# Patient Record
Sex: Female | Born: 1975 | Race: Black or African American | Hispanic: No | Marital: Single | Smoking: Former smoker
Health system: Southern US, Community
[De-identification: ages and names within clinical notes are randomized; demographics above are authoritative.]

## PROBLEM LIST (undated history)

## (undated) HISTORY — PX: TUBAL LIGATION: SHX77

---

## 1998-06-29 ENCOUNTER — Inpatient Hospital Stay (HOSPITAL_COMMUNITY): Admission: AD | Admit: 1998-06-29 | Discharge: 1998-06-29 | Payer: Self-pay | Admitting: Obstetrics

## 1998-06-29 ENCOUNTER — Emergency Department (HOSPITAL_COMMUNITY): Admission: EM | Admit: 1998-06-29 | Discharge: 1998-06-29 | Payer: Self-pay | Admitting: *Deleted

## 1998-07-11 ENCOUNTER — Inpatient Hospital Stay (HOSPITAL_COMMUNITY): Admission: AD | Admit: 1998-07-11 | Discharge: 1998-07-11 | Payer: Self-pay | Admitting: Obstetrics

## 1998-07-31 ENCOUNTER — Other Ambulatory Visit: Admission: RE | Admit: 1998-07-31 | Discharge: 1998-07-31 | Payer: Self-pay | Admitting: Obstetrics

## 1998-07-31 ENCOUNTER — Ambulatory Visit (HOSPITAL_COMMUNITY): Admission: RE | Admit: 1998-07-31 | Discharge: 1998-07-31 | Payer: Self-pay | Admitting: Obstetrics

## 1998-12-05 ENCOUNTER — Ambulatory Visit (HOSPITAL_COMMUNITY): Admission: RE | Admit: 1998-12-05 | Discharge: 1998-12-05 | Payer: Self-pay | Admitting: Obstetrics

## 1999-02-03 ENCOUNTER — Encounter (HOSPITAL_COMMUNITY): Admission: RE | Admit: 1999-02-03 | Discharge: 1999-02-11 | Payer: Self-pay | Admitting: Obstetrics

## 1999-02-05 ENCOUNTER — Inpatient Hospital Stay (HOSPITAL_COMMUNITY): Admission: AD | Admit: 1999-02-05 | Discharge: 1999-02-07 | Payer: Self-pay | Admitting: Obstetrics

## 1999-08-23 ENCOUNTER — Inpatient Hospital Stay (HOSPITAL_COMMUNITY): Admission: AD | Admit: 1999-08-23 | Discharge: 1999-08-23 | Payer: Self-pay | Admitting: Obstetrics

## 1999-10-26 ENCOUNTER — Inpatient Hospital Stay (HOSPITAL_COMMUNITY): Admission: AD | Admit: 1999-10-26 | Discharge: 1999-10-26 | Payer: Self-pay | Admitting: Obstetrics

## 1999-10-26 ENCOUNTER — Encounter: Payer: Self-pay | Admitting: *Deleted

## 1999-12-05 ENCOUNTER — Inpatient Hospital Stay (HOSPITAL_COMMUNITY): Admission: AD | Admit: 1999-12-05 | Discharge: 1999-12-06 | Payer: Self-pay | Admitting: Obstetrics

## 2000-03-16 ENCOUNTER — Other Ambulatory Visit: Admission: RE | Admit: 2000-03-16 | Discharge: 2000-03-16 | Payer: Self-pay | Admitting: Obstetrics

## 2000-05-28 ENCOUNTER — Emergency Department (HOSPITAL_COMMUNITY): Admission: EM | Admit: 2000-05-28 | Discharge: 2000-05-28 | Payer: Self-pay | Admitting: Emergency Medicine

## 2000-08-08 ENCOUNTER — Emergency Department (HOSPITAL_COMMUNITY): Admission: EM | Admit: 2000-08-08 | Discharge: 2000-08-08 | Payer: Self-pay | Admitting: Emergency Medicine

## 2000-08-08 ENCOUNTER — Encounter: Payer: Self-pay | Admitting: Emergency Medicine

## 2000-09-23 ENCOUNTER — Emergency Department (HOSPITAL_COMMUNITY): Admission: EM | Admit: 2000-09-23 | Discharge: 2000-09-23 | Payer: Self-pay

## 2000-09-24 ENCOUNTER — Emergency Department (HOSPITAL_COMMUNITY): Admission: EM | Admit: 2000-09-24 | Discharge: 2000-09-24 | Payer: Self-pay | Admitting: *Deleted

## 2000-10-01 ENCOUNTER — Encounter: Payer: Self-pay | Admitting: Emergency Medicine

## 2000-10-01 ENCOUNTER — Emergency Department (HOSPITAL_COMMUNITY): Admission: EM | Admit: 2000-10-01 | Discharge: 2000-10-02 | Payer: Self-pay | Admitting: Emergency Medicine

## 2001-06-24 ENCOUNTER — Emergency Department (HOSPITAL_COMMUNITY): Admission: EM | Admit: 2001-06-24 | Discharge: 2001-06-24 | Payer: Self-pay

## 2001-10-03 ENCOUNTER — Inpatient Hospital Stay (HOSPITAL_COMMUNITY): Admission: AD | Admit: 2001-10-03 | Discharge: 2001-10-03 | Payer: Self-pay | Admitting: *Deleted

## 2002-03-14 ENCOUNTER — Emergency Department (HOSPITAL_COMMUNITY): Admission: EM | Admit: 2002-03-14 | Discharge: 2002-03-14 | Payer: Self-pay | Admitting: Emergency Medicine

## 2003-08-25 ENCOUNTER — Emergency Department (HOSPITAL_COMMUNITY): Admission: EM | Admit: 2003-08-25 | Discharge: 2003-08-25 | Payer: Self-pay | Admitting: *Deleted

## 2004-02-18 ENCOUNTER — Emergency Department (HOSPITAL_COMMUNITY): Admission: EM | Admit: 2004-02-18 | Discharge: 2004-02-18 | Payer: Self-pay | Admitting: Emergency Medicine

## 2004-03-24 ENCOUNTER — Inpatient Hospital Stay (HOSPITAL_COMMUNITY): Admission: AD | Admit: 2004-03-24 | Discharge: 2004-03-24 | Payer: Self-pay | Admitting: Family Medicine

## 2004-04-21 ENCOUNTER — Other Ambulatory Visit: Admission: RE | Admit: 2004-04-21 | Discharge: 2004-04-21 | Payer: Self-pay | Admitting: Obstetrics and Gynecology

## 2004-09-04 ENCOUNTER — Inpatient Hospital Stay (HOSPITAL_COMMUNITY): Admission: AD | Admit: 2004-09-04 | Discharge: 2004-09-04 | Payer: Self-pay | Admitting: Obstetrics and Gynecology

## 2004-09-11 ENCOUNTER — Inpatient Hospital Stay (HOSPITAL_COMMUNITY): Admission: AD | Admit: 2004-09-11 | Discharge: 2004-09-11 | Payer: Self-pay | Admitting: Obstetrics and Gynecology

## 2004-09-20 ENCOUNTER — Inpatient Hospital Stay (HOSPITAL_COMMUNITY): Admission: AD | Admit: 2004-09-20 | Discharge: 2004-09-22 | Payer: Self-pay | Admitting: Family Medicine

## 2004-09-21 ENCOUNTER — Encounter (INDEPENDENT_AMBULATORY_CARE_PROVIDER_SITE_OTHER): Payer: Self-pay | Admitting: Specialist

## 2004-09-30 ENCOUNTER — Inpatient Hospital Stay (HOSPITAL_COMMUNITY): Admission: AD | Admit: 2004-09-30 | Discharge: 2004-09-30 | Payer: Self-pay | Admitting: Obstetrics and Gynecology

## 2004-09-30 ENCOUNTER — Emergency Department (HOSPITAL_COMMUNITY): Admission: EM | Admit: 2004-09-30 | Discharge: 2004-09-30 | Payer: Self-pay | Admitting: Emergency Medicine

## 2005-11-26 ENCOUNTER — Emergency Department (HOSPITAL_COMMUNITY): Admission: EM | Admit: 2005-11-26 | Discharge: 2005-11-26 | Payer: Self-pay | Admitting: Emergency Medicine

## 2007-01-20 ENCOUNTER — Emergency Department (HOSPITAL_COMMUNITY): Admission: EM | Admit: 2007-01-20 | Discharge: 2007-01-20 | Payer: Self-pay | Admitting: Emergency Medicine

## 2008-03-06 ENCOUNTER — Emergency Department (HOSPITAL_COMMUNITY): Admission: EM | Admit: 2008-03-06 | Discharge: 2008-03-06 | Payer: Self-pay | Admitting: Emergency Medicine

## 2011-04-16 NOTE — Consult Note (Signed)
NAMEVICKIE, Hicks NO.:  [id number]   MEDICAL RECORD NO.:  [id number]          PATIENT TYPE:  MAT   LOCATION:  MATC                          [facility]   PHYSICIAN:  Janine Limbo, M.D.DATE OF BIRTH:  1976/09/06   DATE OF CONSULTATION:  09/30/2004  DATE OF DISCHARGE:                                   CONSULTATION   HISTORY OF PRESENT ILLNESS:  [name] is a 35 year old female, now para 1-2-  3-3, who had a vaginal delivery on September 20, 2004.  She presented to  [facility] today complaining of boils on her arm, leg and her  upper thigh.  The patient reports that she had similar boils one year ago,  and she was told that she had a staph infection.  The patient's infant was  born at [redacted] weeks gestation, and the infant is currently in the Neonatal  Intensive Care Unit.  She was told that she had to have these boils cultured  prior to seeing her baby in the NICU.  She reports that the area on the  upper thigh is particularly tender.  The other areas are not as tender.   OBSTETRICAL HISTORY:  The patient has had one term vaginal delivery.  She  has had three elective pregnancy terminations.  She had one delivery at [redacted]  weeks gestation where she delivered a 4 pound 6 ounce female infant (Most  recently she had a 32 week delivery where she delivered a little boy at 32  weeks.).   PAST MEDICAL HISTORY:  Denies hypertension and diabetes.   SOCIAL HISTORY:  The patient no longer smokes cigarettes.  Denies alcohol  and recreational drug use.   REVIEW OF SYSTEMS:  Please see history of present illness.   FAMILY HISTORY:  Noncontributory.   PHYSICAL EXAMINATION:  VITAL SIGNS:  Temperature 98.5, pulse 69,  respirations 20, blood pressure 113/64.  HEENT:  Within normal limits.  EXTREMITIES:  An area of induration measuring approximately 3 cm x 3 cm on  the right forearm.  There is a healing scab on the lesion.  Slightly  purulent fluid was expressed  from this lesion.  The area was cultured.  The  patient has two similar lesions on her lower leg.  They are minimally  tender.  The patient has a tender area of induration on her upper thigh on  the right that measures approximately 5x5 cm in size.  The area is not  pointing.  It is indurated and slightly red to touch.   ASSESSMENT:  Recurrent skin infections of uncertain etiology.  The patient  has a past history of staph infections.  The patient denies insect bites or  any other etiologies that she knows of.   PLAN:  The patient was given doxycycline to take twice each day for 10 days  by the emergency room physician at [facility].  The culture was  sent to the lab to look for staph, and in particular, to look for  methicillin-  resistant Staphylococcus.  The patient was told to  sit in a tub of hot water  twice a day in hopes of helping the area on her thigh heal.  She understands  that this area may need to be incised and drained.  She will see her family  physician to have that done if necessary.     Arth   AVS/MEDQ  D:  09/30/2004  T:  10/01/2004  Job:  [id number]

## 2011-04-16 NOTE — H&P (Signed)
NAME:  Rachel Hicks, Rachel Hicks                  ACCOUNT NO.:  [id number]   MEDICAL RECORD NO.:  [id number]          PATIENT TYPE:  INP   LOCATION:  9163                          [facility]   PHYSICIAN:  Naima A. Dillard, M.D. DATE OF BIRTH:  1976/08/28   DATE OF ADMISSION:  09/20/2004  DATE OF DISCHARGE:                                HISTORY & PHYSICAL   HISTORY OF PRESENT PREGNANCY:  The patient is a [id number] presenting with  PPROM  She was counseled by Dr. Pennie Rushing regarding starting Delalutin injections for  prevention of preterm labor, which the patient declined at 18 weeks.  She  was scheduled for genetic counseling at [facility] for  heterozygous CF mutation.  Ultrasound at 19 weeks showed size equal to dates  and normal anatomy.  She has been size equal to dates throughout,  normotensive with no proteinuria.  She was evaluated at maternity admissions  on September 04, 2004 for occasional contractions.  Her fetal fibronectin at  that time was negative.  She was diagnosed with Trichomonas and medicated  with metronidazole 2 grams.  She had a closed and long cervix and irregular  contractions, which stopped following terbutaline.  She was therefore  discharged at that time and on 09/11/2004,  she presented to maternity  admissions with questionable rupture of membranes.  Sterile speculum exam at  that time found no pooling,  negative nitrazine, negative ferning, and with  no evidence of rupture of membranes, she was discharged home.  She was noted  to have a positive group B Strep culture.   PRENATAL LABORATORY:  Hemoglobin and hematocrit 11.6 and 34.5, platelets  275,000.  Blood type and Rh B positive, antibody screen negative.  Sickle  cell trait negative.  VDRL nonreactive.  Rubella immune.  Hepatitis B  surface antigen negative.  HIV nonreactive.  Pap smear within normal limits.  GC and Chlamydia negative.  Positive CF carrier.  Quad screen declined.  One  hour glucose  challenge 112.  RPR nonreactive.  Hemoglobin 11.7.  Fetal  fibronectin on September 04, 2004 was negative.  Group B Strep at that time was  positive.   OBSTETRIC HISTORY:  In 2000, the patient had a spontaneous vaginal delivery  with the birth of a 6 pound 8 ounce female named Derrick, with no  complications.  In 2001, the patient had a preterm delivery at approximately  [redacted] weeks gestation with the birth of a 4 pound 6 ounce female infant named  [place].  The baby was in the NICU x1 week.  In 2002, 2003, and 2004, the  patient had first trimester elective ABs with no complications.  Now, she is  pregnant with her sixth pregnancy.   MEDICAL HISTORY:  Significant for preterm labor and delivery with her last  pregnancy.  She was diagnosed with Trichomonas in 2003 and also with this  pregnancy.  She has history of cigarette smoking.  She quit with positive  UPT.  She denies the use of alcohol of illicit drugs.   FAMILY HISTORY:  Unremarkable.   GENETIC HISTORY:  There is no family history of familiar or genetic  disorders, children that died in infancy, or that were born with birth  defects.  The patient is found to be positive as a CF carrier.   SOCIAL HISTORY:  Ms. Chambless is a 35 year old African American female.  She is  single and the father of the baby is not involved.  She is [employer] in her  faith.   REVIEW OF SYSTEMS:  The patient presents with complaints of severe  contractions and lower abdominal pain.  She was found to be 2 cm dilated  with vertex at a -1 station.  She was given terbutaline subcu and found to  have rupture of membranes.  She is admitted to [facility] with preterm premature rupture of membranes and preterm labor.   PHYSICAL EXAMINATION:  Vital signs stable, afebrile.  HEENT:  Unremarkable.  HEART:  Regular rate and rhythm.  LUNGS:  Clear.  ABDOMEN:  Gravid in its contour.  Uterine fundus is noted to extend 31 cm  above the level of the pubic  symphysis.  Leopold maneuver signs the infant  to be in a longitudinal lie, cephalic presentation and the estimated fetal  weight is four and a half pounds. The fetal heart rate is 170s with positive  variability.  There are no contractions noted on tocometer, however, the  patient is writhing and moving all over the bed.  Her cervix was noted to be  2 cm dilated, 90%V effaced with a vertex at a -1 station.  She is leaking  clear fluid, ferning positive.  EXTREMITIES:  No pathologic edema.  DTRs are 1+ with no clonus.   Terbutaline 0.25 mg subcu was given.   ASSESSMENT:  1.  Intrauterine pregnancy at 31 6/7 weeks.  2.  Premature rupture of membranes in preterm labor.   PLAN:  1.  Admit per Dr. Jaymes Graff.  2.  Start magnesium sulfate 4 gram bolus IV, then 2 gram/hour continuously      and medicate patient with betamethasone 12 mg IM now and repeat in 24      hours.  3.  Obtain complete OB ultrasound.  4.  Start penicillin G prophylaxis for positive group B strep.  5.  The patient was taken to birthing suites to begin tocolysis.     Pecolia Ades   SDM/MEDQ  D:  09/20/2004  T:  09/20/2004  Job:  [id number]

## 2011-04-16 NOTE — Op Note (Signed)
NAMEATHINA, FAHEY                  ACCOUNT NO.:  [id number]   MEDICAL RECORD NO.:  [id number]          PATIENT TYPE:  INP   LOCATION:  9319                          [facility]   PHYSICIAN:  Janine Limbo, M.D.DATE OF BIRTH:  11-11-1976   DATE OF PROCEDURE:  09/21/2004  DATE OF DISCHARGE:                                 OPERATIVE REPORT   PREOPERATIVE DIAGNOSES:  1.  Postpartum day #1.  2.  Desires sterilization.   POSTOPERATIVE DIAGNOSES:  1.  Postpartum day #1.  2.  Desires sterilization.   PROCEDURE:  Postpartum bilateral tubal ligation.   SURGEON:  Janine Limbo, M.D.   ANESTHESIA:  General.   DISPOSITION:  Ms. Kerrick is a 35 year old female, now para 1-2-3-3, who had a  32-week delivery on September 20, 2004.  That infant is currently in the  neonatal intensive care unit.  The patient desires permanent sterilization.  She understands the risks associated with her procedure, which include, but  are not limited to, anesthetic complications, bleeding, infections, possible  damage to the surrounding organs, and possible tubal failure (17 per 1000).   FINDINGS:  The fallopian tubes were normal bilaterally.   PROCEDURE:  The patient was taken to the operating room, where a general  anesthetic was given.  The patient's abdomen was prepped with multiple  layers of Betadine and then sterilely draped.  The subumbilical area was  injected with 7 mL of 0.5% Marcaine with epinephrine.  A low transverse  incision was made and carried sharply through the subcutaneous tissue, the  fascia, and the anterior peritoneum.  The left fallopian tube was identified  and followed to its fimbriated end.  A knuckle of tube was made on the left  using a free tie and then a suture ligature of 0 plain catgut.  The knuckle  of tube thus made was excised.  Hemostasis was adequate.  An identical  procedure was carried out on the opposite side.  Again hemostasis was  adequate.  All instruments  were removed.  The fascia was closed using a  running suture of 0 Vicryl.  The skin was reapproximated using a  subcuticular suture of 4-0 Vicryl.  The sponge, needle, and instrument  counts were correct on two occasions.  The estimated blood loss was  essentially zero.  The patient tolerated her procedure well.  She was  awakened from her anesthetic and taken to the recovery room in stable  condition.     Arth   AVS/MEDQ  D:  09/21/2004  T:  09/21/2004  Job:  [id number]

## 2011-04-16 NOTE — H&P (Signed)
NAME:  GAZELLE, TOWE                  ACCOUNT NO.:  [id number]   MEDICAL RECORD NO.:  [id number]          PATIENT TYPE:  INP   LOCATION:  9163                          [facility]   PHYSICIAN:  Naima A. Dillard, M.D. DATE OF BIRTH:  02-17-1976   DATE OF ADMISSION:  09/20/2004  DATE OF DISCHARGE:                                HISTORY & PHYSICAL   HISTORY OF PRESENT ILLNESS:  Ms. Kentner is a 35 year old gravida 6, para 1-1-3-  2, at 31-6/7 weeks, EDD November 17, 2004, as determined by six-week  ultrasound and confirmed with followup.  She presents with contractions and  lower abdominal pain which have increased.  She is moaning and crying out  with her contractions.  She reports positive fetal movement.  She is not  having any vaginal bleeding.  Questionable rupture of membranes.  The  patient states she has been leaking fluid for approximately one week.  She  denies any PIH symptoms.  No nausea or vomiting or elevated temperature.  Her pregnancy has been followed at [facility] and is remarkable for:  1.  ?LMP.  2.  Smoker, quit with positive UPT.  3.  Preterm labor and delivery.  4.  Three ABs.  5.  CF carrier.  6.  GBS positive.   This patient was initially evaluated at the office of [facility] on Apr 21, 2004  at 10-weeks' gestation, [facility] determined by six-week ultrasound and confirmed  with followup.   Dictation ended at this point.      SDM/MEDQ  D:  09/20/2004  T:  09/20/2004  Job:  [id number]

## 2011-04-16 NOTE — Discharge Summary (Signed)
Rachel Hicks, Rachel Hicks                  ACCOUNT NO.:  [id number]   MEDICAL RECORD NO.:  [id number]          PATIENT TYPE:  INP   LOCATION:  9319                          [facility]   PHYSICIAN:  Hal Morales, M.D.DATE OF BIRTH:  05-22-1976   DATE OF ADMISSION:  09/20/2004  DATE OF DISCHARGE:                                 DISCHARGE SUMMARY   ADMITTING DIAGNOSES:  1.  Intrauterine pregnancy at 31 and six-sevenths weeks.  2.  Premature prolonged rupture of membranes.  3.  Preterm labor.   DISCHARGE DIAGNOSES:  1.  Premature possible prolonged rupture of membranes.  2.  Preterm labor.  3.  Desired tubal sterilization.  4.  Elevated white blood cell count.   PROCEDURES:  1.  Normal spontaneous vaginal delivery.  2.  Tubal sterilization.  3.  Repair of second degree labial laceration.   HOSPITAL COURSE:  Rachel Hicks is a 35 year old gravida 6 para 1-1-3-2 at 63 and  six-sevenths weeks who presented on September 20, 2004 with contractions,  possible rupture of membranes at an unknown time.  Her pregnancy had been  remarkable for:  1.  Questionable LMP.  2.  Previous smoker.  3.  Three ABs.  4.  Cystic fibrosis carrier.  5.  Positive group B strep.  6.  History of preterm delivery at 34 weeks.   On admission, cervix was 2 cm, 90%, vertex at a -1 station with ruptured  membranes, clear fluid noted and verified.  She was contracting frequently  although contractions did not trace on the monitor.  Fetal heart rate was in  the 170s.  The patient received one dose of subcu terbutaline and was placed  on magnesium.  She received one dose of betamethasone and penicillin  prophylaxis was begun for positive group B strep.  She, however, progressed  very rapidly to fully dilated.  She pushed for approximately 29 minutes for  a spontaneous vaginal delivery of a viable female, weight 4 pounds .03 ounces.  Apgars were 6 and 9.  The infant was taken to the NICU.  Mother was taken to  recovery  room in good condition.  The patient had a second degree right  labial laceration.  The patient did desire tubal sterilization.  On  postpartum day #1, she was doing well.  Her vital signs were stable.  Her  hemoglobin was 11.3.  Her white blood cell count was 22.3, up from 3.2.  Platelets were 257, up from 214.  Dr. Stefano Gaul spoke with the patient and  consented her for a tubal sterilization.  This was performed by him under  general anesthesia.  Findings were normal tubes and ovaries.  There were no  complications.  The patient tolerated the procedure well.  By postpartum day  #2, postoperative day #1, the patient was doing well.  She was up ad lib,  she was bottle feeding.  Her incision was noted to have a dressing on it  with a small amount of old drainage marked.  Her fundus was firm, lochia was  scant.  She had positive bowel  sounds.  She had mild tenderness in her tubal  ligation site.  Her chest was clear, her perineum was healing.  CBC was  repeated to verify diminishing white blood cell count.  She was deemed to  have received the full benefit of her hospital stay and was discharged home.   DISCHARGE INSTRUCTIONS:  Per [facility] handout.   DISCHARGE MEDICATIONS:  1.  Motrin 600 mg p.o. q.6h. p.r.n. pain.  2.  Tylox one to two p.o. q.3-4h. p.r.n. pain.   DISCHARGE FOLLOW-UP:  Will occur in 6 weeks at [facility].   DISCHARGE CONDITION:  Stable.     Vick   VLL/MEDQ  D:  09/22/2004  T:  09/22/2004  Job:  [id number]

## 2011-06-25 ENCOUNTER — Emergency Department (HOSPITAL_COMMUNITY)
Admission: EM | Admit: 2011-06-25 | Discharge: 2011-06-25 | Disposition: A | Discharge status: Home / Self Care | Payer: No Typology Code available for payment source | Attending: Emergency Medicine | Admitting: Emergency Medicine

## 2011-06-25 DIAGNOSIS — S0990XA Unspecified injury of head, initial encounter: Secondary | ICD-10-CM | POA: Insufficient documentation

## 2011-06-25 DIAGNOSIS — Y92410 Unspecified street and highway as the place of occurrence of the external cause: Secondary | ICD-10-CM | POA: Insufficient documentation

## 2011-06-25 DIAGNOSIS — M549 Dorsalgia, unspecified: Secondary | ICD-10-CM | POA: Insufficient documentation

## 2011-06-25 DIAGNOSIS — M542 Cervicalgia: Secondary | ICD-10-CM | POA: Insufficient documentation

## 2011-11-18 ENCOUNTER — Encounter: Payer: Self-pay | Admitting: Emergency Medicine

## 2011-11-18 ENCOUNTER — Emergency Department (HOSPITAL_COMMUNITY)
Admission: EM | Admit: 2011-11-18 | Discharge: 2011-11-18 | Disposition: A | Discharge status: Home / Self Care | Payer: No Typology Code available for payment source | Attending: Emergency Medicine | Admitting: Emergency Medicine

## 2011-11-18 DIAGNOSIS — M25519 Pain in unspecified shoulder: Secondary | ICD-10-CM | POA: Insufficient documentation

## 2011-11-18 DIAGNOSIS — M545 Low back pain, unspecified: Secondary | ICD-10-CM | POA: Insufficient documentation

## 2011-11-18 DIAGNOSIS — T148XXA Other injury of unspecified body region, initial encounter: Secondary | ICD-10-CM | POA: Insufficient documentation

## 2011-11-18 DIAGNOSIS — F17200 Nicotine dependence, unspecified, uncomplicated: Secondary | ICD-10-CM | POA: Insufficient documentation

## 2011-11-18 MED ORDER — IBUPROFEN 800 MG PO TABS: 800.0000 mg | ORAL_TABLET | Freq: Three times a day (TID) | ORAL | Status: AC | PRN

## 2011-11-18 MED ORDER — METHOCARBAMOL 500 MG PO TABS: 1000.0000 mg | ORAL_TABLET | Freq: Four times a day (QID) | ORAL | Status: AC
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
## 2011-11-19 NOTE — ED Provider Notes (Signed)
Medical screening examination/treatment/procedure(s) were performed by non-physician practitioner and as supervising physician I was immediately available for consultation/collaboration.   Elanna Bert W. Shynice Sigel, MD 11/19/11 1618 

## 2013-12-01 ENCOUNTER — Emergency Department (HOSPITAL_COMMUNITY)
Admission: EM | Admit: 2013-12-01 | Discharge: 2013-12-01 | Disposition: A | Discharge status: Home / Self Care | Payer: No Typology Code available for payment source | Attending: Emergency Medicine | Admitting: Emergency Medicine

## 2013-12-01 ENCOUNTER — Encounter (HOSPITAL_COMMUNITY): Payer: Self-pay | Admitting: Emergency Medicine

## 2013-12-01 ENCOUNTER — Emergency Department (HOSPITAL_COMMUNITY): Payer: No Typology Code available for payment source

## 2013-12-01 DIAGNOSIS — S335XXA Sprain of ligaments of lumbar spine, initial encounter: Secondary | ICD-10-CM | POA: Insufficient documentation

## 2013-12-01 DIAGNOSIS — F17200 Nicotine dependence, unspecified, uncomplicated: Secondary | ICD-10-CM | POA: Insufficient documentation

## 2013-12-01 DIAGNOSIS — S39012A Strain of muscle, fascia and tendon of lower back, initial encounter: Secondary | ICD-10-CM

## 2013-12-01 DIAGNOSIS — Y92410 Unspecified street and highway as the place of occurrence of the external cause: Secondary | ICD-10-CM | POA: Insufficient documentation

## 2013-12-01 DIAGNOSIS — Y9389 Activity, other specified: Secondary | ICD-10-CM | POA: Insufficient documentation

## 2013-12-01 DIAGNOSIS — S46911A Strain of unspecified muscle, fascia and tendon at shoulder and upper arm level, right arm, initial encounter: Secondary | ICD-10-CM

## 2013-12-01 DIAGNOSIS — IMO0002 Reserved for concepts with insufficient information to code with codable children: Secondary | ICD-10-CM | POA: Insufficient documentation

## 2013-12-01 MED ORDER — CYCLOBENZAPRINE HCL 10 MG PO TABS: 10.0000 mg | ORAL_TABLET | Freq: Two times a day (BID) | ORAL | Status: DC | PRN

## 2013-12-01 MED ORDER — IBUPROFEN 600 MG PO TABS: 600.0000 mg | ORAL_TABLET | Freq: Four times a day (QID) | ORAL | Status: DC | PRN

## 2013-12-01 MED ORDER — HYDROCODONE-ACETAMINOPHEN 5-325 MG PO TABS: 2.0000 | ORAL_TABLET | ORAL | Status: DC | PRN
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
## 2015-10-27 ENCOUNTER — Other Ambulatory Visit (HOSPITAL_COMMUNITY)
Admission: RE | Admit: 2015-10-27 | Discharge: 2015-10-27 | Disposition: A | Discharge status: Home / Self Care | Payer: No Typology Code available for payment source | Source: Ambulatory Visit | Attending: Geriatric Medicine | Admitting: Geriatric Medicine

## 2015-10-27 ENCOUNTER — Other Ambulatory Visit: Payer: Self-pay | Admitting: Geriatric Medicine

## 2015-10-27 DIAGNOSIS — Z1151 Encounter for screening for human papillomavirus (HPV): Secondary | ICD-10-CM | POA: Insufficient documentation

## 2015-10-27 DIAGNOSIS — Z01419 Encounter for gynecological examination (general) (routine) without abnormal findings: Secondary | ICD-10-CM | POA: Insufficient documentation

## 2015-10-28 ENCOUNTER — Other Ambulatory Visit: Payer: Self-pay | Admitting: Geriatric Medicine

## 2015-10-28 ENCOUNTER — Ambulatory Visit
Admission: RE | Admit: 2015-10-28 | Discharge: 2015-10-28 | Disposition: A | Discharge status: Home / Self Care | Payer: No Typology Code available for payment source | Source: Ambulatory Visit | Attending: Geriatric Medicine | Admitting: Geriatric Medicine

## 2015-10-28 DIAGNOSIS — N6001 Solitary cyst of right breast: Secondary | ICD-10-CM

## 2015-10-28 DIAGNOSIS — M79642 Pain in left hand: Secondary | ICD-10-CM

## 2015-10-29 LAB — CYTOLOGY - PAP

## 2015-11-04 ENCOUNTER — Other Ambulatory Visit: Payer: No Typology Code available for payment source

## 2016-06-05 ENCOUNTER — Encounter (HOSPITAL_COMMUNITY): Payer: Self-pay | Admitting: Oncology

## 2016-06-05 ENCOUNTER — Emergency Department (HOSPITAL_COMMUNITY)
Admission: EM | Admit: 2016-06-05 | Discharge: 2016-06-06 | Disposition: A | Discharge status: Home / Self Care | Payer: No Typology Code available for payment source | Attending: Emergency Medicine | Admitting: Emergency Medicine

## 2016-06-05 DIAGNOSIS — Y92410 Unspecified street and highway as the place of occurrence of the external cause: Secondary | ICD-10-CM | POA: Diagnosis not present

## 2016-06-05 DIAGNOSIS — S8012XA Contusion of left lower leg, initial encounter: Secondary | ICD-10-CM | POA: Diagnosis not present

## 2016-06-05 DIAGNOSIS — Y999 Unspecified external cause status: Secondary | ICD-10-CM | POA: Insufficient documentation

## 2016-06-05 DIAGNOSIS — Y9389 Activity, other specified: Secondary | ICD-10-CM | POA: Diagnosis not present

## 2016-06-05 DIAGNOSIS — S8992XA Unspecified injury of left lower leg, initial encounter: Secondary | ICD-10-CM | POA: Diagnosis present

## 2016-06-05 DIAGNOSIS — F17200 Nicotine dependence, unspecified, uncomplicated: Secondary | ICD-10-CM | POA: Insufficient documentation

## 2016-06-05 MED ORDER — IBUPROFEN 200 MG PO TABS
600.0000 mg | ORAL_TABLET | Freq: Once | ORAL | Status: AC
  Administered 2016-06-05: 600 mg via ORAL
  Filled 2016-06-05: qty 3

## 2016-06-05 MED ORDER — IBUPROFEN 600 MG PO TABS: 600.0000 mg | ORAL_TABLET | Freq: Three times a day (TID) | ORAL | Status: DC | PRN
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
## 2016-06-05 NOTE — Discharge Instructions (Signed)

## 2016-06-06 NOTE — ED Notes (Signed)
Patient d/c'd self care.  F/U and medications reviewed.  Patient verbalized understanding. 

## 2017-02-07 ENCOUNTER — Encounter (HOSPITAL_COMMUNITY): Payer: Self-pay | Admitting: *Deleted

## 2017-02-07 ENCOUNTER — Emergency Department (HOSPITAL_COMMUNITY)
Admission: EM | Admit: 2017-02-07 | Discharge: 2017-02-07 | Disposition: A | Discharge status: Home / Self Care | Payer: No Typology Code available for payment source | Attending: Emergency Medicine | Admitting: Emergency Medicine

## 2017-02-07 DIAGNOSIS — B9689 Other specified bacterial agents as the cause of diseases classified elsewhere: Secondary | ICD-10-CM

## 2017-02-07 DIAGNOSIS — N939 Abnormal uterine and vaginal bleeding, unspecified: Secondary | ICD-10-CM

## 2017-02-07 DIAGNOSIS — F17200 Nicotine dependence, unspecified, uncomplicated: Secondary | ICD-10-CM | POA: Insufficient documentation

## 2017-02-07 DIAGNOSIS — N760 Acute vaginitis: Secondary | ICD-10-CM | POA: Insufficient documentation

## 2017-02-07 LAB — CBC WITH DIFFERENTIAL/PLATELET
BASOS ABS: 0 10*3/uL (ref 0.0–0.1)
Basophils Relative: 1 %
EOS ABS: 0.1 10*3/uL (ref 0.0–0.7)
EOS PCT: 2 %
HCT: 37 % (ref 36.0–46.0)
Hemoglobin: 12.9 g/dL (ref 12.0–15.0)
Lymphocytes Relative: 46 %
Lymphs Abs: 2.5 10*3/uL (ref 0.7–4.0)
MCH: 31.9 pg (ref 26.0–34.0)
MCHC: 34.9 g/dL (ref 30.0–36.0)
MCV: 91.4 fL (ref 78.0–100.0)
MONO ABS: 0.6 10*3/uL (ref 0.1–1.0)
Monocytes Relative: 11 %
Neutro Abs: 2.1 10*3/uL (ref 1.7–7.7)
Neutrophils Relative %: 40 %
PLATELETS: 333 10*3/uL (ref 150–400)
RBC: 4.05 MIL/uL (ref 3.87–5.11)
RDW: 12.2 % (ref 11.5–15.5)
WBC: 5.3 10*3/uL (ref 4.0–10.5)

## 2017-02-07 LAB — WET PREP, GENITAL
Sperm: NONE SEEN
Trich, Wet Prep: NONE SEEN
YEAST WET PREP: NONE SEEN

## 2017-02-07 LAB — URINALYSIS, ROUTINE W REFLEX MICROSCOPIC
GLUCOSE, UA: NEGATIVE mg/dL
KETONES UR: 5 mg/dL — AB
NITRITE: NEGATIVE
PROTEIN: 30 mg/dL — AB
Specific Gravity, Urine: 1.032 — ABNORMAL HIGH (ref 1.005–1.030)
pH: 5 (ref 5.0–8.0)

## 2017-02-07 LAB — PREGNANCY, URINE: Preg Test, Ur: NEGATIVE

## 2017-02-07 MED ORDER — METRONIDAZOLE 500 MG PO TABS: 500.0000 mg | ORAL_TABLET | Freq: Two times a day (BID) | ORAL | 0 refills | Status: AC

## 2017-02-07 NOTE — ED Provider Notes (Signed)
[facility]-EMERGENCY DEPT Provider Note   CSN: [id number] Arrival date & time: 02/07/17  0901     History   Chief Complaint Chief Complaint  Patient presents with  . Vaginal Bleeding    HPI Rachel Hicks is a 41 y.o. female G6 P1 [id number] presenting with significant bleeding with intercourse. She denies any pain associated with intercourse but has been noticing bright red blood gushing and fleshy type tissue as well as blood clots. LMP was 12/31/2016. She reports being mostly regular with cycles at the beginning of the month but sometimes switching to the end of the month. She reports a tubal ligation in 2005 and has been using condoms. This occurred after she reports not having any intercourse for 7 years and did not have any trouble a week ago the first time. The second time she had intercourse 3 days ago, she denied any pain, but noticed pinkish fleshy tissue "like meat" and yesterday she noticed more large clots. She explains that this is heavier bleeding than her period and the bleeding stops a few hours after intercourse. Currently she has no bleeding, pain or any other complaints. She denies fever, chills, nausea, vomiting, diarrhea, hematuria, dysuria, abdominal pain or any other symptoms.  HPI  History reviewed. No pertinent past medical history.  There are no active problems to display for this patient.   No past surgical history on file.  OB History    No data available       Home Medications    Prior to Admission medications   Medication Sig Start Date End Date Taking? Authorizing Provider  ibuprofen (ADVIL,MOTRIN) 600 MG tablet Take 1 tablet (600 mg total) by mouth every 8 (eight) hours as needed. 06/05/16   Azalia BilisKevin Campos, MD  metroNIDAZOLE (FLAGYL) 500 MG tablet Take 1 tablet (500 mg total) by mouth 2 (two) times daily. 02/07/17 02/14/17  Georgiana ShoreJessica B Ronit Cranfield, PA-C    Family History No family history on file.  Social History Social History  Substance Use Topics  . Smoking  status: Current Every Day Smoker  . Smokeless tobacco: Never Used  . Alcohol use Yes     Allergies   Patient has no known allergies.   Review of Systems Review of Systems  Constitutional: Negative for chills, fatigue and fever.  HENT: Negative for ear pain and sore throat.   Respiratory: Negative for cough, chest tightness and shortness of breath.   Cardiovascular: Negative for chest pain and palpitations.  Gastrointestinal: Negative for abdominal distention, abdominal pain, blood in stool, constipation, diarrhea, nausea, rectal pain and vomiting.  Genitourinary: Positive for vaginal bleeding. Negative for decreased urine volume, difficulty urinating, dysuria, flank pain, hematuria and pelvic pain.       With intercourse only x 2. Not currently bleeding  Musculoskeletal: Negative for arthralgias, back pain, myalgias, neck pain and neck stiffness.  Skin: Negative for color change, pallor and rash.  Neurological: Negative for dizziness, seizures, syncope, weakness, light-headedness and headaches.  All other systems reviewed and are negative.    Physical Exam Updated Vital Signs BP 141/83   Pulse 89   Temp 98.3 F (36.8 C) (Oral)   Resp 18   LMP 12/31/2016   SpO2 98%   Physical Exam  Constitutional: She is oriented to person, place, and time. She appears well-developed and well-nourished. No distress.  The patient is afebrile, nontoxic-appearing, sitting comfortably in bed in no acute distress.  HENT:  Head: Normocephalic and atraumatic.  Eyes: Conjunctivae and EOM are normal.  Neck: Normal range of motion. Neck supple.  Cardiovascular: Normal rate, regular rhythm and normal heart sounds.   No murmur heard. Pulmonary/Chest: Effort normal and breath sounds normal. No respiratory distress.  Abdominal: Soft. Bowel sounds are normal. She exhibits no distension. There is no tenderness. There is no rebound and no guarding.  Genitourinary: Vagina normal. No vaginal discharge  found.  Genitourinary Comments: Cervix is friable. No adnexal mass palpated. No cervical motion tenderness. No blood in the vaginal vault  Musculoskeletal: Normal range of motion. She exhibits no edema.  Neurological: She is alert and oriented to person, place, and time.  Skin: Skin is warm and dry. No rash noted. She is not diaphoretic. No erythema. No pallor.  Psychiatric: She has a normal mood and affect. Her behavior is normal.  Nursing note and vitals reviewed.    ED Treatments / Results  Labs (all labs ordered are listed, but only abnormal results are displayed) Labs Reviewed  WET PREP, GENITAL - Abnormal; Notable for the following:       Result Value   Clue Cells Wet Prep HPF POC PRESENT (*)    WBC, Wet Prep HPF POC FEW (*)    All other components within normal limits  URINALYSIS, ROUTINE W REFLEX MICROSCOPIC - Abnormal; Notable for the following:    Color, Urine AMBER (*)    APPearance CLOUDY (*)    Specific Gravity, Urine 1.032 (*)    Hgb urine dipstick MODERATE (*)    Bilirubin Urine MODERATE (*)    Ketones, ur 5 (*)    Protein, ur 30 (*)    Leukocytes, UA MODERATE (*)    Bacteria, UA RARE (*)    Squamous Epithelial / LPF 6-30 (*)    All other components within normal limits  PREGNANCY, URINE  CBC WITH DIFFERENTIAL/PLATELET  RPR  HIV ANTIBODY (ROUTINE TESTING)  GC/CHLAMYDIA PROBE AMP ([place]) NOT AT [facility]    EKG  EKG Interpretation None       Radiology No results found.  Procedures Procedures (including critical care time)  Medications Ordered in ED Medications - No data to display   Initial Impression / Assessment and Plan / ED Course  I have reviewed the triage vital signs and the nursing notes.  Pertinent labs & imaging results that were available during my care of the patient were reviewed by me and considered in my medical decision making (see chart for details).     Patient presents with abnormal uterine bleeding after  intercourse.  Cervix appears friable. Obtained pregnancy urine, U/A, STI panel and CBC Labs in ED were unremarkable other than evidence of bacterial vaginosis. Will treat with metronidazole. Exam otherwise unremarkable  Patient was stable and not currently bleeding.  D/c home with close f/u with GYN.  Discussed strict return precautions. Patient was advised to return to the emergency department if experiencing any new or worsening symptoms. Patient clearly understood instructions and agreed with discharge plan. Patient was discussed with Dr. Linwood Dibbles who agrees with assessment and plan. Final Clinical Impressions(s) / ED Diagnoses   Final diagnoses:  Abnormal vaginal bleeding  BV (bacterial vaginosis)    New Prescriptions New Prescriptions   METRONIDAZOLE (FLAGYL) 500 MG TABLET    Take 1 tablet (500 mg total) by mouth 2 (two) times daily.     Georgiana Shore, PA-C 02/07/17 1116    Linwood Dibbles, MD 02/08/17 [phone]

## 2017-02-07 NOTE — ED Triage Notes (Signed)
Pt reports having a sexual intercourse over the weekend, first time after 7 years. She sts first time there was no issues, however second and third time she had a "gushing of blood" during intercourse. Sts bleeding stopped few hours later each time. Pt sts second time she noticed large blood clots, as well. Sts minimal pain during intercourse.

## 2017-02-07 NOTE — Discharge Instructions (Signed)
Refrain from intercourse until you see your gynecologist. Take your entire course of antibiotic over the next 7 days and avoid alcohol while on this medication. Stay well hydrated.  Return to the emergency department if you experience lightheadedness, pallor, weakness, shortness of breath or other concerning symptoms in the meantime.

## 2017-02-08 LAB — RPR: RPR Ser Ql: NONREACTIVE

## 2017-02-08 LAB — GC/CHLAMYDIA PROBE AMP (~~LOC~~) NOT AT ARMC
Chlamydia: NEGATIVE
NEISSERIA GONORRHEA: NEGATIVE

## 2017-02-08 LAB — HIV ANTIBODY (ROUTINE TESTING W REFLEX): HIV Screen 4th Generation wRfx: NONREACTIVE

## 2017-03-29 DIAGNOSIS — Y999 Unspecified external cause status: Secondary | ICD-10-CM | POA: Insufficient documentation

## 2017-03-29 DIAGNOSIS — F17200 Nicotine dependence, unspecified, uncomplicated: Secondary | ICD-10-CM | POA: Insufficient documentation

## 2017-03-29 DIAGNOSIS — Y929 Unspecified place or not applicable: Secondary | ICD-10-CM | POA: Insufficient documentation

## 2017-03-29 DIAGNOSIS — W540XXA Bitten by dog, initial encounter: Secondary | ICD-10-CM | POA: Insufficient documentation

## 2017-03-29 DIAGNOSIS — S91351A Open bite, right foot, initial encounter: Secondary | ICD-10-CM | POA: Insufficient documentation

## 2017-03-29 DIAGNOSIS — Y939 Activity, unspecified: Secondary | ICD-10-CM | POA: Insufficient documentation

## 2017-03-30 ENCOUNTER — Emergency Department (HOSPITAL_COMMUNITY): Payer: No Typology Code available for payment source

## 2017-03-30 ENCOUNTER — Encounter (HOSPITAL_COMMUNITY): Payer: Self-pay | Admitting: Emergency Medicine

## 2017-03-30 ENCOUNTER — Emergency Department (HOSPITAL_COMMUNITY)
Admission: EM | Admit: 2017-03-30 | Discharge: 2017-03-30 | Disposition: A | Discharge status: Home / Self Care | Payer: No Typology Code available for payment source | Attending: Emergency Medicine | Admitting: Emergency Medicine

## 2017-03-30 DIAGNOSIS — W540XXA Bitten by dog, initial encounter: Secondary | ICD-10-CM

## 2017-03-30 MED ORDER — OXYCODONE-ACETAMINOPHEN 5-325 MG PO TABS
1.0000 | ORAL_TABLET | Freq: Once | ORAL | Status: AC
  Administered 2017-03-30: 1 via ORAL
  Filled 2017-03-30: qty 1

## 2017-03-30 MED ORDER — TETANUS-DIPHTH-ACELL PERTUSSIS 5-2.5-18.5 LF-MCG/0.5 IM SUSP
0.5000 mL | Freq: Once | INTRAMUSCULAR | Status: AC
  Administered 2017-03-30: 0.5 mL via INTRAMUSCULAR
  Filled 2017-03-30: qty 0.5

## 2017-03-30 MED ORDER — AMOXICILLIN-POT CLAVULANATE 875-125 MG PO TABS: 1.0000 | ORAL_TABLET | Freq: Two times a day (BID) | ORAL | 0 refills | Status: DC

## 2017-03-30 MED ORDER — IBUPROFEN 600 MG PO TABS: 600.0000 mg | ORAL_TABLET | Freq: Four times a day (QID) | ORAL | 0 refills | Status: DC | PRN

## 2017-03-30 MED ORDER — AMOXICILLIN-POT CLAVULANATE 875-125 MG PO TABS
1.0000 | ORAL_TABLET | Freq: Once | ORAL | Status: AC
  Administered 2017-03-30: 1 via ORAL
  Filled 2017-03-30: qty 1
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
## 2017-03-30 NOTE — ED Triage Notes (Signed)
Patient bit by her friend's dog at right foot yesterday with mild swelling , no bleeding , ambulatory , dog's immunization complete .

## 2017-03-30 NOTE — ED Notes (Signed)
See EDP secondary assessment.  

## 2017-03-30 NOTE — ED Provider Notes (Signed)
[facility]-EMERGENCY DEPT Provider Note   CSN: [id number] Arrival date & time: 03/29/17  2347     History   Chief Complaint Chief Complaint  Patient presents with  . Animal Bite    Dog    HPI Rachel Hicks is a 41 y.o. female.  HPI  41 y.o. female, presents to the Emergency Department today due to dog bite. Noted friend's dog bit her foot PTA. Pt states dog's vaccination status UTD.  Noted mild swelling. No bleeding. Pt able to ambulate with mild difficulty. No numbness/tingling. No redness. No purulence from wound. No erythema. No fevers. Rates pain 6/10. Worse with palpation. Tdap not UTD. No other symptoms noted.     History reviewed. No pertinent past medical history.  There are no active problems to display for this patient.   History reviewed. No pertinent surgical history.  OB History    No data available       Home Medications    Prior to Admission medications   Medication Sig Start Date End Date Taking? Authorizing Provider  ibuprofen (ADVIL,MOTRIN) 600 MG tablet Take 1 tablet (600 mg total) by mouth every 8 (eight) hours as needed. 06/05/16   Azalia Bilis, MD    Family History No family history on file.  Social History Social History  Substance Use Topics  . Smoking status: Current Every Day Smoker  . Smokeless tobacco: Never Used  . Alcohol use Yes     Allergies   Patient has no known allergies.   Review of Systems Review of Systems  Constitutional: Positive for fever.  Gastrointestinal: Negative for nausea and vomiting.  Musculoskeletal: Negative for myalgias.  Skin: Positive for wound.  Allergic/Immunologic: Negative for immunocompromised state.   Physical Exam Updated Vital Signs BP 138/76   Pulse 68   Temp 98.1 F (36.7 C) (Oral)   Resp (!) 21   Ht  (1.753 m)   Wt 86.2 kg   LMP 03/05/2017 (Approximate)   SpO2 95%   BMI 28.06 kg/m   Physical Exam  Constitutional: She is oriented to person, place, and time. Vital signs are  normal. She appears well-developed and well-nourished.  HENT:  Head: Normocephalic.  Right Ear: Hearing normal.  Left Ear: Hearing normal.  Eyes: Conjunctivae and EOM are normal. Pupils are equal, round, and reactive to light.  Cardiovascular: Normal rate and regular rhythm.   Pulmonary/Chest: Effort normal.  Musculoskeletal:  Right foot with two puncture wounds noted on dorsal aspect. Superficial. No erythema. No swelling. No purulence. No signs of infection.   Neurological: She is alert and oriented to person, place, and time.  Skin: Skin is warm and dry.  Psychiatric: She has a normal mood and affect. Her speech is normal and behavior is normal. Thought content normal.  Nursing note and vitals reviewed.  ED Treatments / Results  Labs (all labs ordered are listed, but only abnormal results are displayed) Labs Reviewed - No data to display  EKG  EKG Interpretation None       Radiology Dg Foot Complete Right  Result Date: 03/30/2017 CLINICAL DATA:  Dog bite injury with swelling EXAM: RIGHT FOOT COMPLETE - 3+ VIEW COMPARISON:  None. FINDINGS: There is no evidence of fracture or dislocation. There is no evidence of arthropathy or other focal bone abnormality. Soft tissues are unremarkable. IMPRESSION: Negative. Electronically Signed   By: Jasmine Pang M.D.   On: 03/30/2017 01:20    Procedures Procedures (including critical care time)  Medications Ordered in ED  Medications  amoxicillin-clavulanate (AUGMENTIN) 875-125 MG per tablet 1 tablet (not administered)  oxyCODONE-acetaminophen (PERCOCET/ROXICET) 5-325 MG per tablet 1 tablet (not administered)  Tdap (BOOSTRIX) injection 0.5 mL (not administered)     Initial Impression / Assessment and Plan / ED Course  I have reviewed the triage vital signs and the nursing notes.  Pertinent labs & imaging results that were available during my care of the patient were reviewed by me and considered in my medical decision making (see  chart for details).  Final Clinical Impressions(s) / ED Diagnoses     {I have reviewed the relevant previous healthcare records.  {I obtained HPI from historian.   ED Course:  Assessment: Pt is a 41 y.o. female  today due to dog bite. Noted friend's dog bit her foot PTA. Pt states dog's vaccination status UTD.  Noted mild swelling. No bleeding. Pt able to ambulate with mild difficulty. No numbness/tingling. No redness. No purulence from wound. No erythema. No fevers. Rates pain 6/10. Worse with palpation. Tdap not UTD. On exam, pt in NAD. Nontoxic/nonseptic appearing. VSS. Afebrile. Dog bite without erythema. No swelling. No purulence. Imaging negative. Given Augmentin and Tdap in ED. DC home with Augmentin. Cleaned wound in ED. Plan is to DC home with follow up to PCP. Strict return precautions given. At time of discharge, Patient is in no acute distress. Vital Signs are stable. Patient is able to ambulate. Patient able to tolerate PO.   Disposition/Plan:  DC Home Additional Verbal discharge instructions given and discussed with patient.  Pt Instructed to f/u with PCP in the next week for evaluation and treatment of symptoms. Return precautions given Pt acknowledges and agrees with plan  Supervising Physician April Palumbo, MD  Final diagnoses:  Dog bite, initial encounter    New Prescriptions New Prescriptions   No medications on file     Audry Pili, PA-C 03/30/17 0403    April Palumbo, MD 03/30/17 [phone]

## 2017-03-30 NOTE — ED Notes (Signed)
Paged Ortho for post op shoe. Discharge completed.

## 2018-02-07 ENCOUNTER — Encounter (HOSPITAL_COMMUNITY): Payer: Self-pay | Admitting: Emergency Medicine

## 2018-02-07 ENCOUNTER — Ambulatory Visit (HOSPITAL_COMMUNITY)
Admission: EM | Admit: 2018-02-07 | Discharge: 2018-02-07 | Disposition: A | Discharge status: Home / Self Care | Payer: Self-pay | Attending: Nurse Practitioner | Admitting: Nurse Practitioner

## 2018-02-07 ENCOUNTER — Emergency Department (HOSPITAL_COMMUNITY)
Admission: EM | Admit: 2018-02-07 | Discharge: 2018-02-07 | Discharge status: Against Medical Advice | Payer: No Typology Code available for payment source | Attending: Emergency Medicine | Admitting: Emergency Medicine

## 2018-02-07 DIAGNOSIS — Z5321 Procedure and treatment not carried out due to patient leaving prior to being seen by health care provider: Secondary | ICD-10-CM | POA: Insufficient documentation

## 2018-02-07 DIAGNOSIS — R69 Illness, unspecified: Secondary | ICD-10-CM

## 2018-02-07 DIAGNOSIS — J111 Influenza due to unidentified influenza virus with other respiratory manifestations: Secondary | ICD-10-CM

## 2018-02-07 MED ORDER — OSELTAMIVIR PHOSPHATE 75 MG PO CAPS: 75.0000 mg | ORAL_CAPSULE | Freq: Two times a day (BID) | ORAL | 0 refills | Status: DC

## 2018-02-07 NOTE — ED Triage Notes (Signed)
PT reports fever, body aches, chills. Denies NVD. Symptoms for 3 days.  Tylenol at 1500 today

## 2018-02-07 NOTE — ED Provider Notes (Signed)
[facility]    CSN: [id number] Arrival date & time: 02/07/18  1829     History   Chief Complaint Chief Complaint  Patient presents with  . Influenza    HPI Rachel Hicks is a 42 y.o. female.   Subjective:   Rachel Hicks is a 42 y.o. female who presents for evaluation of influenza like symptoms. Symptoms include chills, headache, myalgias and fevers with a TMAX of 102. Symptoms have been present for 3 days. She has tried to alleviate the symptoms with acetaminophen, rest and OTC cold/flu medications with minimal relief. Patient has no high risk factors for influenza complications.   The following portions of the patient's history were reviewed and updated as appropriate: allergies, current medications, past family history, past medical history, past social history, past surgical history and problem list.        History reviewed. No pertinent past medical history.  There are no active problems to display for this patient.   Past Surgical History:  Procedure Laterality Date  . TUBAL LIGATION      OB History    No data available       Home Medications    Prior to Admission medications   Medication Sig Start Date End Date Taking? Authorizing Provider  ibuprofen (ADVIL,MOTRIN) 600 MG tablet Take 1 tablet (600 mg total) by mouth every 6 (six) hours as needed. 03/30/17  Yes Audry PiliMohr, Tyler, PA-C  amoxicillin-clavulanate (AUGMENTIN) 875-125 MG tablet Take 1 tablet by mouth every 12 (twelve) hours. 03/30/17   Audry PiliMohr, Tyler, PA-C    Family History No family history on file.  Social History Social History   Tobacco Use  . Smoking status: Current Every Day Smoker  . Smokeless tobacco: Never Used  Substance Use Topics  . Alcohol use: Yes  . Drug use: Yes    Types: Marijuana     Allergies   Patient has no known allergies.   Review of Systems Review of Systems  Constitutional: Positive for chills, fatigue and fever.  Respiratory: Negative for cough and  shortness of breath.   Cardiovascular: Negative for chest pain.  Neurological: Positive for headaches.  All other systems reviewed and are negative.    Physical Exam Triage Vital Signs ED Triage Vitals [02/07/18 1946]  Enc Vitals Group     BP (!) 142/80     Pulse Rate 78     Resp 16     Temp 99.7 F (37.6 C)     Temp Source Oral     SpO2 100 %     Weight 185 lb (83.9 kg)     Height      Head Circumference      Peak Flow      Pain Score 7     Pain Loc      Pain Edu?      Excl. in GC?    No data found.  Updated Vital Signs BP (!) 142/80   Pulse 78   Temp 99.7 F (37.6 C) (Oral)   Resp 16   Wt 185 lb (83.9 kg)   LMP 01/29/2018   SpO2 100%   BMI 27.32 kg/m   Visual Acuity Right Eye Distance:   Left Eye Distance:   Bilateral Distance:    Right Eye Near:   Left Eye Near:    Bilateral Near:     Physical Exam  Constitutional: She is oriented to person, place, and time. She appears well-developed and well-nourished.  HENT:  Head: Normocephalic and atraumatic.  Right Ear: External ear normal.  Left Ear: External ear normal.  Mouth/Throat: Oropharynx is clear and moist.  Eyes: Conjunctivae and EOM are normal. Pupils are equal, round, and reactive to light.  Neck: Normal range of motion. Neck supple.  Cardiovascular: Normal rate and regular rhythm.  Pulmonary/Chest: Effort normal and breath sounds normal.  Musculoskeletal: Normal range of motion.  Neurological: She is alert and oriented to person, place, and time.  Skin: Skin is warm and dry.  Psychiatric: She has a normal mood and affect.     UC Treatments / Results  Labs (all labs ordered are listed, but only abnormal results are displayed) Labs Reviewed - No data to display  EKG  EKG Interpretation None       Radiology No results found.  Procedures Procedures (including critical care time)  Medications Ordered in UC Medications - No data to display   Initial Impression / Assessment and  Plan / UC Course  I have reviewed the triage vital signs and the nursing notes.  Pertinent labs & imaging results that were available during my care of the patient were reviewed by me and considered in my medical decision making (see chart for details).    42 year old female who presents with influenza-like symptoms for the past 3 days. Supportive care with appropriate antipyretics and fluids. Antivirals per orders. Discussed diagnosis and treatment with patient. All questions have been answered and all concerns have been addressed. The patient verbalized understanding and had no further questions   Final Clinical Impressions(s) / UC Diagnoses   Final diagnoses:  Influenza-like illness    ED Discharge Orders    None       Controlled Substance Prescriptions [place] Controlled Substance Registry consulted? Not Applicable   Lurline Idol, FNP 02/07/18 2020

## 2018-02-07 NOTE — ED Triage Notes (Signed)
Patient c/o cough, congestion, body aches and fever x2 days. Reports being around others with similar symptoms. Denies N/V/D.

## 2019-01-02 ENCOUNTER — Other Ambulatory Visit: Payer: Self-pay

## 2019-01-02 ENCOUNTER — Encounter (HOSPITAL_COMMUNITY): Payer: Self-pay | Admitting: Student

## 2019-01-02 ENCOUNTER — Emergency Department (HOSPITAL_COMMUNITY): Payer: Medicaid Other

## 2019-01-02 ENCOUNTER — Emergency Department (HOSPITAL_COMMUNITY)
Admission: EM | Admit: 2019-01-02 | Discharge: 2019-01-02 | Disposition: A | Discharge status: Home / Self Care | Payer: Medicaid Other | Attending: Emergency Medicine | Admitting: Emergency Medicine

## 2019-01-02 DIAGNOSIS — Z79899 Other long term (current) drug therapy: Secondary | ICD-10-CM | POA: Insufficient documentation

## 2019-01-02 DIAGNOSIS — Y929 Unspecified place or not applicable: Secondary | ICD-10-CM | POA: Diagnosis not present

## 2019-01-02 DIAGNOSIS — Y939 Activity, unspecified: Secondary | ICD-10-CM | POA: Insufficient documentation

## 2019-01-02 DIAGNOSIS — Z23 Encounter for immunization: Secondary | ICD-10-CM | POA: Insufficient documentation

## 2019-01-02 DIAGNOSIS — S51811A Laceration without foreign body of right forearm, initial encounter: Secondary | ICD-10-CM | POA: Diagnosis not present

## 2019-01-02 DIAGNOSIS — Y999 Unspecified external cause status: Secondary | ICD-10-CM | POA: Insufficient documentation

## 2019-01-02 DIAGNOSIS — S59911A Unspecified injury of right forearm, initial encounter: Secondary | ICD-10-CM | POA: Diagnosis present

## 2019-01-02 DIAGNOSIS — F17200 Nicotine dependence, unspecified, uncomplicated: Secondary | ICD-10-CM | POA: Insufficient documentation

## 2019-01-02 DIAGNOSIS — W540XXA Bitten by dog, initial encounter: Secondary | ICD-10-CM | POA: Insufficient documentation

## 2019-01-02 DIAGNOSIS — Z2914 Encounter for prophylactic rabies immune globin: Secondary | ICD-10-CM | POA: Insufficient documentation

## 2019-01-02 MED ORDER — AMOXICILLIN-POT CLAVULANATE 875-125 MG PO TABS: 1.0000 | ORAL_TABLET | Freq: Two times a day (BID) | ORAL | 0 refills | Status: DC

## 2019-01-02 MED ORDER — TETANUS-DIPHTH-ACELL PERTUSSIS 5-2.5-18.5 LF-MCG/0.5 IM SUSP
0.5000 mL | Freq: Once | INTRAMUSCULAR | Status: AC
  Administered 2019-01-02: 0.5 mL via INTRAMUSCULAR
  Filled 2019-01-02: qty 0.5

## 2019-01-02 MED ORDER — AMOXICILLIN-POT CLAVULANATE 875-125 MG PO TABS
1.0000 | ORAL_TABLET | Freq: Once | ORAL | Status: AC
  Administered 2019-01-02: 1 via ORAL
  Filled 2019-01-02: qty 1

## 2019-01-02 MED ORDER — BACITRACIN ZINC 500 UNIT/GM EX OINT
TOPICAL_OINTMENT | Freq: Once | CUTANEOUS | Status: AC
  Administered 2019-01-02: 19:00:00 via TOPICAL

## 2019-01-02 MED ORDER — RABIES VACCINE, PCEC IM SUSR
1.0000 mL | Freq: Once | INTRAMUSCULAR | Status: AC
  Administered 2019-01-02: 1 mL via INTRAMUSCULAR
  Filled 2019-01-02: qty 1

## 2019-01-02 MED ORDER — RABIES IMMUNE GLOBULIN 150 UNIT/ML IM INJ
20.0000 [IU]/kg | INJECTION | Freq: Once | INTRAMUSCULAR | Status: AC
  Administered 2019-01-02: 1725 [IU] via INTRAMUSCULAR
  Filled 2019-01-02: qty 11.5

## 2019-01-02 MED ORDER — OXYCODONE-ACETAMINOPHEN 5-325 MG PO TABS
1.0000 | ORAL_TABLET | Freq: Once | ORAL | Status: AC
  Administered 2019-01-02: 1 via ORAL
  Filled 2019-01-02: qty 1

## 2019-01-02 MED ORDER — NAPROXEN 500 MG PO TABS: 500.0000 mg | ORAL_TABLET | Freq: Two times a day (BID) | ORAL | 0 refills | Status: DC

## 2019-01-02 MED ORDER — LIDOCAINE-EPINEPHRINE (PF) 2 %-1:200000 IJ SOLN
10.0000 mL | Freq: Once | INTRAMUSCULAR | Status: AC
  Administered 2019-01-02: 10 mL
  Filled 2019-01-02: qty 20

## 2019-01-02 NOTE — ED Notes (Signed)
Patient transported to X-ray 

## 2019-01-02 NOTE — Discharge Instructions (Signed)
You were seen in the ER today for an animal bite.  Your x-ray did not show broken bones or fractures. Each of your laceration was closed with 1 stitch each. Please keep this area clean and dry for the next 24 hours, after 24 hours you may get this area wet, but avoid soaking the area. Keep the area covered as best possible especially when in the sun to help in minimizing scarring.   Your tetanus has been updated.   Your rabies vaccines were started in the ER, you will need to follow-up per the attached letter for the vaccine series.   On Augmentin as dog bites are high risk for infection.  Augmentin is an antibiotic.  Please take this as prescribed and complete the full course of the antibiotic.  We are also send you home with naproxen to help with pain.Naproxen  is a nonsteroidal anti-inflammatory medication that will help with pain and swelling. Be sure to take this medication as prescribed with food, 1 pill every 12 hours,  It should be taken with food, as it can cause stomach upset, and more seriously, stomach bleeding. Do not take other nonsteroidal anti-inflammatory medications with this such as Advil, Motrin, Aleve, Mobic, Goodie Powder, or Motrin.   You make take Tylenol per over the counter dosing with these medications.   We have prescribed you new medication(s) today. Discuss the medications prescribed today with your pharmacist as they can have adverse effects and interactions with your other medicines including over the counter and prescribed medications. Seek medical evaluation if you start to experience new or abnormal symptoms after taking one of these medicines, seek care immediately if you start to experience difficulty breathing, feeling of your throat closing, facial swelling, or rash as these could be indications of a more serious allergic reaction  Use the sling provided for comfort, please be sure to move the shoulder plenty in the sling as it can increase risk for frozen shoulder  if you are not moving it.   You will need to have the stitches removed and the wound rechecked in 7 days. Please return to the emergency department, go to an urgent care, or see your primary care provider to have this performed. Return to the ER soon should you start to experience pus type drainage from the wound, redness around the wound, or fevers as this could indicate the area is infected, please return to the ER for any other worsening symptoms or concerns that you may have.

## 2019-01-02 NOTE — ED Triage Notes (Signed)
Pt presents with dog bite to R forearm today. Bleeding controlled. Pt took 1600 mg ibuprofen PTA.

## 2019-01-02 NOTE — ED Provider Notes (Signed)
[facility] EMERGENCY DEPARTMENT Provider Note   CSN: [id number] Arrival date & time: 01/02/19  1550     History   Chief Complaint Chief Complaint  Patient presents with  . Animal Bite    HPI Rachel Hicks is a 43 y.o. female with a history of tobacco abuse and prior tubal ligation who presents to the emergency department status post dog bite which occurred 1 hour prior to arrival.  Patient states she was ambulating when a presumed stray dog without a collar bit her right forearm.  States she had to pull the dog off of her.  She has multiple puncture/abrasions to the right forearm, she notes no other areas of injury.   States the area is painful, 10 out of 10 in severity, she did take 800 mg of ibuprofen prior to arrival.  Denies numbness, weakness, or other areas of injury.  Patient is left-hand dominant.  Unsure when her last tetanus shot was.  States rabies vaccine status of the dog is unknown, the dog did not have an owner nearby, she is unsure where the dog is located.  She has never received rabies prophylaxis in the past. Denies chance of pregnancy.   HPI  History reviewed. No pertinent past medical history.  There are no active problems to display for this patient.   Past Surgical History:  Procedure Laterality Date  . TUBAL LIGATION       OB History   No obstetric history on file.      Home Medications    Prior to Admission medications   Medication Sig Start Date End Date Taking? Authorizing Provider  amoxicillin-clavulanate (AUGMENTIN) 875-125 MG tablet Take 1 tablet by mouth every 12 (twelve) hours. 03/30/17   Audry Pili, PA-C  ibuprofen (ADVIL,MOTRIN) 600 MG tablet Take 1 tablet (600 mg total) by mouth every 6 (six) hours as needed. 03/30/17   Audry Pili, PA-C  oseltamivir (TAMIFLU) 75 MG capsule Take 1 capsule (75 mg total) by mouth every 12 (twelve) hours. 02/07/18   Lurline Idol, FNP    Family History History reviewed. No pertinent  family history.  Social History Social History   Tobacco Use  . Smoking status: Current Every Day Smoker  . Smokeless tobacco: Never Used  Substance Use Topics  . Alcohol use: Yes  . Drug use: Yes    Types: Marijuana     Allergies   Patient has no known allergies.   Review of Systems Review of Systems  Constitutional: Negative for chills and fever.  Gastrointestinal: Negative for nausea and vomiting.  Musculoskeletal: Positive for myalgias (R forearm).  Skin: Positive for wound.  Neurological: Negative for weakness and numbness.     Physical Exam Updated Vital Signs BP (!) 154/83 (BP Location: Left Arm)   Pulse 82   Temp 98.5 F (36.9 C) (Oral)   Resp 20   Ht 5\' 8"  (1.727 m)   Wt 86.2 kg   LMP 12/25/2018 (Exact Date)   SpO2 100%   BMI 28.89 kg/m   Physical Exam Vitals signs and nursing note reviewed.  Constitutional:      General: She is not in acute distress.    Appearance: She is well-developed.  HENT:     Head: Normocephalic and atraumatic.  Eyes:     General:        Right eye: No discharge.        Left eye: No discharge.     Conjunctiva/sclera: Conjunctivae normal.  Cardiovascular:  Comments: 2+ symmetric radial pulses.  Musculoskeletal:     Comments: Upper extremities: Right forearm: The dorsum of the forearm has a 0.75 cm somewhat gaping wound with controlled bleeding & exposed SQ fat. Just proximal to this is a 3mm fairly superficial laceration. Just distal there are several superficial abrasions without active bleeding. Ventral aspect of mid forearm there is a 1 cm somewhat gaping wound with controlled bleeding & exposed SQ fat. Ventral aspect of the wrist has a linear abrasion also without active bleeding. Patient has intact full AROM to bilateral shoulders, elbows, wrists, and all digits. She is tender over the mid forearm otherwise nontender upper extremities. NVI distally.   Skin:    Capillary Refill: Capillary refill takes less than 2  seconds.  Neurological:     Mental Status: She is alert.     Comments: Clear speech.  Sensation grossly intact bilateral upper extremities.  5 out of 5 symmetric grip strength.  Able to perform okay sign, thumbs up, and cross second and third digits bilaterally.  Psychiatric:        Behavior: Behavior normal.        Thought Content: Thought content normal.      ED Treatments / Results  Labs (all labs ordered are listed, but only abnormal results are displayed) Labs Reviewed - No data to display  EKG None  Radiology Dg Forearm Right  Result Date: 01/02/2019 CLINICAL DATA:  Mid right forearm dog bite today. EXAM: RIGHT FOREARM - 2 VIEW COMPARISON:  None. FINDINGS: Dorsal soft tissue swelling and gas or air. There is also ventral bandage material with a small amount of soft tissue air or gas ventrally. No fracture or dislocation seen. IMPRESSION: Soft tissue injury with associated gas or air with no underlying fracture. Electronically Signed   By: Beckie SaltsSteven  Reid M.D.   On: 01/02/2019 16:44    Procedures .Marland Kitchen.Laceration Repair Date/Time: 01/02/2019 6:07 PM Performed by: Cherly AndersonPetrucelli, Docie Abramovich R, PA-C Authorized by: Cherly AndersonPetrucelli, Jakyren Fluegge R, PA-C   Consent:    Consent obtained:  Verbal   Consent given by:  Patient   Risks discussed:  Infection, pain, need for additional repair, nerve damage, poor wound healing, poor cosmetic result, retained foreign body, tendon damage and vascular damage   Alternatives discussed:  No treatment Anesthesia (see MAR for exact dosages):    Anesthesia method:  Local infiltration   Local anesthetic:  Lidocaine 2% WITH epi Laceration details:    Location:  Shoulder/arm   Shoulder/arm location:  R lower arm   Length (cm):  1 Repair type:    Repair type:  Simple Pre-procedure details:    Preparation:  Patient was prepped and draped in usual sterile fashion and imaging obtained to evaluate for foreign bodies Exploration:    Hemostasis achieved with:  Direct  pressure   Wound exploration: wound explored through full range of motion and entire depth of wound probed and visualized     Wound extent: areolar tissue violated     Wound extent: no fascia violation noted, no foreign bodies/material noted, no muscle damage noted, no nerve damage noted, no tendon damage noted, no underlying fracture noted and no vascular damage noted     Contaminated: no   Treatment:    Area cleansed with:  Betadine   Amount of cleaning:  Extensive   Irrigation solution:  Sterile water   Irrigation volume:  2L   Irrigation method:  Pressure wash Skin repair:    Repair method:  Sutures   Suture  size:  4-0   Suture material:  Nylon   Suture technique:  Simple interrupted   Number of sutures:  1 Approximation:    Approximation:  Loose Post-procedure details:    Dressing:  Antibiotic ointment and non-adherent dressing   Patient tolerance of procedure:  Tolerated well, no immediate complications .Marland KitchenLaceration Repair Date/Time: 01/02/2019 6:09 PM Performed by: Cherly Anderson, PA-C Authorized by: Cherly Anderson, PA-C   Consent:    Consent obtained:  Verbal   Consent given by:  Patient   Risks discussed:  Infection, need for additional repair, nerve damage, poor wound healing, poor cosmetic result, pain, retained foreign body, tendon damage and vascular damage   Alternatives discussed:  No treatment Anesthesia (see MAR for exact dosages):    Anesthesia method:  Local infiltration   Local anesthetic:  Lidocaine 2% WITH epi Laceration details:    Location:  Shoulder/arm   Shoulder/arm location:  R lower arm   Length (cm):  0.8 Repair type:    Repair type:  Simple Pre-procedure details:    Preparation:  Patient was prepped and draped in usual sterile fashion and imaging obtained to evaluate for foreign bodies Exploration:    Hemostasis achieved with:  Direct pressure   Wound exploration: wound explored through full range of motion and entire depth of  wound probed and visualized     Wound extent: areolar tissue violated     Wound extent: no fascia violation noted, no foreign bodies/material noted, no muscle damage noted, no nerve damage noted, no tendon damage noted, no underlying fracture noted and no vascular damage noted     Contaminated: no   Treatment:    Area cleansed with:  Betadine   Amount of cleaning:  Extensive   Irrigation solution:  Sterile water   Irrigation volume:  2L   Irrigation method:  Pressure wash Skin repair:    Repair method:  Sutures   Suture size:  4-0   Suture material:  Nylon   Suture technique:  Simple interrupted   Number of sutures:  1 Approximation:    Approximation:  Loose Post-procedure details:    Dressing:  Antibiotic ointment and non-adherent dressing   Patient tolerance of procedure:  Tolerated well, no immediate complications   (including critical care time)  Medications Ordered in ED Medications  oxyCODONE-acetaminophen (PERCOCET/ROXICET) 5-325 MG per tablet 1 tablet (1 tablet Oral Given 01/02/19 1626)  amoxicillin-clavulanate (AUGMENTIN) 875-125 MG per tablet 1 tablet (1 tablet Oral Given 01/02/19 1626)  lidocaine-EPINEPHrine (XYLOCAINE W/EPI) 2 %-1:200000 (PF) injection 10 mL (10 mLs Infiltration Given by Other 01/02/19 1626)  Tdap (BOOSTRIX) injection 0.5 mL (0.5 mLs Intramuscular Given 01/02/19 1627)  rabies immune globulin (HYPERAB/KEDRAB) injection 1,725 Units (1,725 Units Intramuscular Given 01/02/19 1719)  rabies vaccine (RABAVERT) injection 1 mL (1 mL Intramuscular Given 01/02/19 1720)  bacitracin ointment ( Topical Given 01/02/19 1831)     Initial Impression / Assessment and Plan / ED Course  I have reviewed the triage vital signs and the nursing notes.  Pertinent labs & imaging results that were available during my care of the patient were reviewed by me and considered in my medical decision making (see chart for details).    Patient presents to the emergency department with wound  secondary to a dog bite which occurred 1 hour PTA. Patient nontoxic appearing, resting comfortably. X-ray obtained in area of wounds, no fractures/dislocations or apparent radiopaque foreign bodies. The wounds appear fairly clean without evidence of FB. Extensive pressure irrigation performed.  Wounds explored and base of wounds visualized in a bloodless field without evidence of foreign body. Laceration repairs per procedure note above- closed loosely secondary to animal bite, tolerated well.  Tetanus updated at today's visit.  Unfortunately patient does not have access to the animal that bit her & description consistent with a stray, therefore rabies prophylaxis was initiated. Will place on Augmentin.  Discussed suture home care as well as need for wound recheck and suture removal in 7 days & follow up for subsequent rabies vaccines (specific dates provided for series).  I discussed results, treatment plan, need for follow-up, and return precautions with the patient including signs of infection. Provided opportunity for questions, patient confirmed understanding and is in agreement with plan.     Final Clinical Impressions(s) / ED Diagnoses   Final diagnoses:  Dog bite, initial encounter    ED Discharge Orders         Ordered    amoxicillin-clavulanate (AUGMENTIN) 875-125 MG tablet  Every 12 hours     01/02/19 1815    naproxen (NAPROSYN) 500 MG tablet  2 times daily     01/02/19 [address]1815           Rommel Hogston, Pleas KochSamantha R, PA-C 01/02/19 1836    Eber HongMiller, Brian, MD 01/02/19 1907

## 2019-08-20 ENCOUNTER — Other Ambulatory Visit (HOSPITAL_COMMUNITY): Payer: Self-pay | Admitting: *Deleted

## 2019-08-20 DIAGNOSIS — Z1231 Encounter for screening mammogram for malignant neoplasm of breast: Secondary | ICD-10-CM

## 2019-11-06 ENCOUNTER — Encounter (HOSPITAL_COMMUNITY): Payer: Self-pay

## 2019-11-06 ENCOUNTER — Ambulatory Visit
Admission: RE | Admit: 2019-11-06 | Discharge: 2019-11-06 | Disposition: A | Discharge status: Home / Self Care | Payer: No Typology Code available for payment source | Source: Ambulatory Visit | Attending: Obstetrics and Gynecology | Admitting: Obstetrics and Gynecology

## 2019-11-06 ENCOUNTER — Other Ambulatory Visit: Payer: Self-pay

## 2019-11-06 ENCOUNTER — Ambulatory Visit (HOSPITAL_COMMUNITY)
Admission: RE | Admit: 2019-11-06 | Discharge: 2019-11-06 | Disposition: A | Discharge status: Home / Self Care | Payer: Medicaid Other | Source: Ambulatory Visit | Attending: Obstetrics and Gynecology | Admitting: Obstetrics and Gynecology

## 2019-11-06 DIAGNOSIS — Z1239 Encounter for other screening for malignant neoplasm of breast: Secondary | ICD-10-CM | POA: Insufficient documentation

## 2019-11-06 DIAGNOSIS — Z1231 Encounter for screening mammogram for malignant neoplasm of breast: Secondary | ICD-10-CM

## 2019-11-06 NOTE — Patient Instructions (Signed)
Explained breast self awareness with Shary Key. Patient did not need a Pap smear today due to last Pap smear and HPV typing was 10/27/2015. Let her know [facility] will cover Pap smears and HPV typing every 5 years unless has a history of abnormal Pap smears. Referred patient to the [facility] for a screening mammogram. Appointment scheduled for Tuesday, November 06, 2019 at 1040. Patient aware of appointment and will be there. Let [facility] know [facility] will follow up with her within the next couple weeks with results of mammogram by letter or phone. Shary Key verbalized understanding.  [place] Krist, Arvil Chaco, RN 10:30 AM

## 2019-11-06 NOTE — Progress Notes (Signed)
No complaints today.   Pap Smear: Pap smear not completed today. Last Pap smear was 10/27/2015 at [facility] and normal with negative HPV and showed Trichomonas. Per patient has no history of an abnormal Pap smear. Last Pap smear result is in Epic.  Physical exam: Breasts Breasts symmetrical. No skin abnormalities bilateral breasts. No nipple retraction bilateral breasts. No nipple discharge bilateral breasts. No lymphadenopathy. No lumps palpated bilateral breasts. No complaints of pain or tenderness on exam. Referred patient to the [facility] for a screening mammogram. Appointment scheduled for Tuesday, November 06, 2019 at 1040.        Pelvic/Bimanual No Pap smear completed today since last Pap smear and HPV typing was 10/27/2015. Pap smear not indicated per [facility] guidelines.   Smoking History: Patient has never smoked.  Patient Navigation: Patient education provided. Access to services provided for patient through [facility] program.   Breast and Cervical Cancer Risk Assessment: Patient has no family history of breast cancer, known genetic mutations, or radiation treatment to the chest before age 64. Patient has no history of cervical dysplasia, immunocompromised, or DES exposure in-utero.  Risk Assessment    Risk Scores      11/06/2019   Last edited by: Loletta Parish, RN   5-year risk: 0.6 %   Lifetime risk: 7.3 %

## 2019-11-07 ENCOUNTER — Other Ambulatory Visit: Payer: Self-pay | Admitting: Obstetrics and Gynecology

## 2019-11-07 DIAGNOSIS — R928 Other abnormal and inconclusive findings on diagnostic imaging of breast: Secondary | ICD-10-CM

## 2019-11-08 ENCOUNTER — Other Ambulatory Visit: Payer: Self-pay | Admitting: Obstetrics and Gynecology

## 2019-11-08 ENCOUNTER — Other Ambulatory Visit: Payer: Self-pay

## 2019-11-08 ENCOUNTER — Ambulatory Visit
Admission: RE | Admit: 2019-11-08 | Discharge: 2019-11-08 | Disposition: A | Discharge status: Home / Self Care | Payer: No Typology Code available for payment source | Source: Ambulatory Visit | Attending: Obstetrics and Gynecology | Admitting: Obstetrics and Gynecology

## 2019-11-08 DIAGNOSIS — N6320 Unspecified lump in the left breast, unspecified quadrant: Secondary | ICD-10-CM

## 2019-11-08 DIAGNOSIS — R928 Other abnormal and inconclusive findings on diagnostic imaging of breast: Secondary | ICD-10-CM

## 2020-01-05 ENCOUNTER — Emergency Department (HOSPITAL_COMMUNITY)
Admission: EM | Admit: 2020-01-05 | Discharge: 2020-01-05 | Disposition: A | Discharge status: Home / Self Care | Payer: Medicaid Other | Attending: Emergency Medicine | Admitting: Emergency Medicine

## 2020-01-05 ENCOUNTER — Other Ambulatory Visit: Payer: Self-pay

## 2020-01-05 ENCOUNTER — Emergency Department (HOSPITAL_COMMUNITY): Payer: Medicaid Other

## 2020-01-05 DIAGNOSIS — Y92009 Unspecified place in unspecified non-institutional (private) residence as the place of occurrence of the external cause: Secondary | ICD-10-CM | POA: Diagnosis not present

## 2020-01-05 DIAGNOSIS — Y939 Activity, unspecified: Secondary | ICD-10-CM | POA: Insufficient documentation

## 2020-01-05 DIAGNOSIS — L089 Local infection of the skin and subcutaneous tissue, unspecified: Secondary | ICD-10-CM | POA: Diagnosis not present

## 2020-01-05 DIAGNOSIS — S61051A Open bite of right thumb without damage to nail, initial encounter: Secondary | ICD-10-CM

## 2020-01-05 DIAGNOSIS — Z79899 Other long term (current) drug therapy: Secondary | ICD-10-CM | POA: Insufficient documentation

## 2020-01-05 DIAGNOSIS — Y999 Unspecified external cause status: Secondary | ICD-10-CM | POA: Insufficient documentation

## 2020-01-05 DIAGNOSIS — S62524A Nondisplaced fracture of distal phalanx of right thumb, initial encounter for closed fracture: Secondary | ICD-10-CM | POA: Diagnosis not present

## 2020-01-05 DIAGNOSIS — Z87891 Personal history of nicotine dependence: Secondary | ICD-10-CM | POA: Diagnosis not present

## 2020-01-05 DIAGNOSIS — W540XXA Bitten by dog, initial encounter: Secondary | ICD-10-CM | POA: Insufficient documentation

## 2020-01-05 MED ORDER — AMOXICILLIN-POT CLAVULANATE 875-125 MG PO TABS
1.0000 | ORAL_TABLET | Freq: Once | ORAL | Status: AC
  Administered 2020-01-05: 12:00:00 1 via ORAL
  Filled 2020-01-05: qty 1

## 2020-01-05 MED ORDER — AMOXICILLIN-POT CLAVULANATE 875-125 MG PO TABS: 1.0000 | ORAL_TABLET | Freq: Two times a day (BID) | ORAL | 0 refills | Status: AC

## 2020-01-05 MED ORDER — ONDANSETRON 8 MG PO TBDP
8.0000 mg | ORAL_TABLET | Freq: Once | ORAL | Status: AC
  Administered 2020-01-05: 13:00:00 8 mg via ORAL
  Filled 2020-01-05: qty 1

## 2020-01-05 MED ORDER — HYDROGEN PEROXIDE 3 % EX SOLN
CUTANEOUS | Status: AC
  Filled 2020-01-05: qty 473

## 2020-01-05 MED ORDER — METOCLOPRAMIDE HCL 10 MG PO TABS: 10.0000 mg | ORAL_TABLET | Freq: Four times a day (QID) | ORAL | 0 refills | Status: AC | PRN

## 2020-01-05 NOTE — ED Notes (Signed)
Patient's finger soaking in sterile saline and hydrogen peroxide as per PA request.

## 2020-01-05 NOTE — Discharge Instructions (Addendum)
Take your antibiotics as directed.  Make sure to complete the entire course of antibiotics.  Follow-up with Dr. Roney Mans this coming week.  Make sure that you are cleansing your wound twice a day.  Return to the emergency department for any worsening in your pain, increased redness, streaking up the hand, severe pain with any joint movement, or fevers.

## 2020-01-05 NOTE — ED Provider Notes (Signed)
[facility]-EMERGENCY DEPT Provider Note   CSN: [id number] Arrival date & time: 01/05/20  1036     History Chief Complaint  Patient presents with  . Animal Bite    Rachel Hicks is a 44 y.o. female who presents emergency department with chief complaint of right thumb pain.  Patient was bitten by one of her own dogs 3 days ago.  She has had progressively worsening swelling, purulent discharge and pain in the right thumb.  She states that her pain is actually improved because she is having some numbness in the thumb at this point.  Her dogs are out of date on their rabies vaccinations by 3 months however they are indoor dogs and do not run around outside so risk of rabies is quite low.  Of note the patient had a dog bite last January to the forearm and had her first series of rabies vaccinations but did not follow-up to complete this.  She also was updated on her tetanus vaccination.  She denies fevers chills.  She denies inability to move the joint.  Tdap -01/02/19  m        No past medical history on file.  Patient Active Problem List   Diagnosis Date Noted  . Screening breast examination 11/06/2019    Past Surgical History:  Procedure Laterality Date  . TUBAL LIGATION       OB History    Gravida  5   Para      Term      Preterm      AB      Living        SAB      TAB      Ectopic      Multiple      Live Births  3           No family history on file.  Social History   Tobacco Use  . Smoking status: Former Smoker    Quit date: 2009    Years since quitting: 12.1  . Smokeless tobacco: Never Used  Substance Use Topics  . Alcohol use: Yes  . Drug use: Yes    Types: Marijuana    Home Medications Prior to Admission medications   Medication Sig Start Date End Date Taking? Authorizing Provider  amoxicillin-clavulanate (AUGMENTIN) 875-125 MG tablet Take 1 tablet by mouth every 12 (twelve) hours. 01/02/19   Petrucelli,  Samantha R, PA-C  naproxen (NAPROSYN) 500 MG tablet Take 1 tablet (500 mg total) by mouth 2 (two) times daily. 01/02/19   Petrucelli, Pleas Koch, PA-C    Allergies    Patient has no known allergies.  Review of Systems   Review of Systems Ten systems reviewed and are negative for acute change, except as noted in the HPI.   Physical Exam Updated Vital Signs BP 136/81 (BP Location: Left Arm)   Pulse 79   Temp 98.2 F (36.8 C) (Oral)   Resp 16   Ht 5\' 8"  (1.727 m)   Wt 86.2 kg   SpO2 100%   BMI 28.89 kg/m   Physical Exam Vitals and nursing note reviewed.  Constitutional:      General: She is not in acute distress.    Appearance: She is well-developed. She is not diaphoretic.  HENT:     Head: Normocephalic and atraumatic.  Eyes:     General: No scleral icterus.    Conjunctiva/sclera: Conjunctivae normal.  Cardiovascular:     Rate and Rhythm:  Normal rate and regular rhythm.     Heart sounds: Normal heart sounds. No murmur. No friction rub. No gallop.   Pulmonary:     Effort: Pulmonary effort is normal. No respiratory distress.     Breath sounds: Normal breath sounds.  Abdominal:     General: Bowel sounds are normal. There is no distension.     Palpations: Abdomen is soft. There is no mass.     Tenderness: There is no abdominal tenderness. There is no guarding.  Musculoskeletal:     Cervical back: Normal range of motion.     Comments: Right thumb with 2 cm laceration with crusting over the thumb pad.  There is swelling, fatty material, purulent discharge from the center of the laceration.  There is also a laceration on the dorsal surface of the thumb involving the proximal nail bed.  Redness and swelling at the distal portion of the right thumb.  Able to move the PIP and MCP  Skin:    General: Skin is warm and dry.  Neurological:     Mental Status: She is alert and oriented to person, place, and time.  Psychiatric:        Behavior: Behavior normal.         ED  Results / Procedures / Treatments   Labs (all labs ordered are listed, but only abnormal results are displayed) Labs Reviewed - No data to display  EKG None  Radiology No results found.  Procedures Procedures (including critical care time)  Medications Ordered in ED Medications  amoxicillin-clavulanate (AUGMENTIN) 875-125 MG per tablet 1 tablet (has no administration in time range)    ED Course  I have reviewed the triage vital signs and the nursing notes.  Pertinent labs & imaging results that were available during my care of the patient were reviewed by me and considered in my medical decision making (see chart for details).    MDM Rules/Calculators/A&P                      Patient case discussed with Dr. Jeannie Fend of [facility].  We reviewed the images.  Patient given Augmentin.  She did have some vomiting here but that was improved after Zofran.  I personally reviewed the patient's x-ray which shows distal phalanx fracture.  This is also reviewed by Dr. Jeannie Fend.  He recommends oral antibiotics and close follow-up in the office with him this week.  Patient agrees with plan and feels comfortable with discharge.  Placed in thumb splint with normal cap refill.  Patient appears appropriate for discharge at this time. Final Clinical Impression(s) / ED Diagnoses Final diagnoses:  Closed nondisplaced fracture of distal phalanx of right thumb, initial encounter  Infected dog bite of thumb, right, initial encounter    Rx / DC Orders ED Discharge Orders    None       Margarita Mail, PA-C 01/05/20 1904    Valarie Merino, MD 01/06/20 [phone]

## 2020-01-05 NOTE — ED Triage Notes (Signed)
Patient reports her dogs were fighting three days ago and she was trying to break up the fight when one of the dogs bit her right thumb. Patient reports pain 6/10. Patient has 3 inch horizontal laceration to right thumb.sensation and feeling in thumb is intact.  Patient concerned laceration may be getting infected.

## 2020-04-24 ENCOUNTER — Other Ambulatory Visit: Payer: Self-pay

## 2020-04-24 ENCOUNTER — Ambulatory Visit
Admission: RE | Admit: 2020-04-24 | Discharge: 2020-04-24 | Disposition: A | Discharge status: Home / Self Care | Payer: No Typology Code available for payment source | Source: Ambulatory Visit | Attending: Obstetrics and Gynecology | Admitting: Obstetrics and Gynecology

## 2020-04-24 DIAGNOSIS — N6320 Unspecified lump in the left breast, unspecified quadrant: Secondary | ICD-10-CM

## 2020-05-15 ENCOUNTER — Other Ambulatory Visit: Payer: No Typology Code available for payment source

## 2021-06-04 ENCOUNTER — Other Ambulatory Visit: Payer: Self-pay

## 2021-06-04 DIAGNOSIS — N6320 Unspecified lump in the left breast, unspecified quadrant: Secondary | ICD-10-CM

## 2021-06-16 ENCOUNTER — Other Ambulatory Visit: Payer: No Typology Code available for payment source

## 2021-06-16 ENCOUNTER — Ambulatory Visit: Payer: No Typology Code available for payment source

## 2021-06-28 IMAGING — US US BREAST*L* LIMITED INC AXILLA
1 series · 6 of 6 positions shown · non-contrast
Comparison: 11/06/2019 baseline mammogram

CLINICAL DATA: 43-year-old female for further evaluation of LEFT
breast mass on baseline screening mammogram.

EXAM:
ULTRASOUND OF THE LEFT BREAST

[Series 1: us breast*left* limited inc axilla · 0.06mm/px · 6 of 6 slices shown]
[im 1/6]
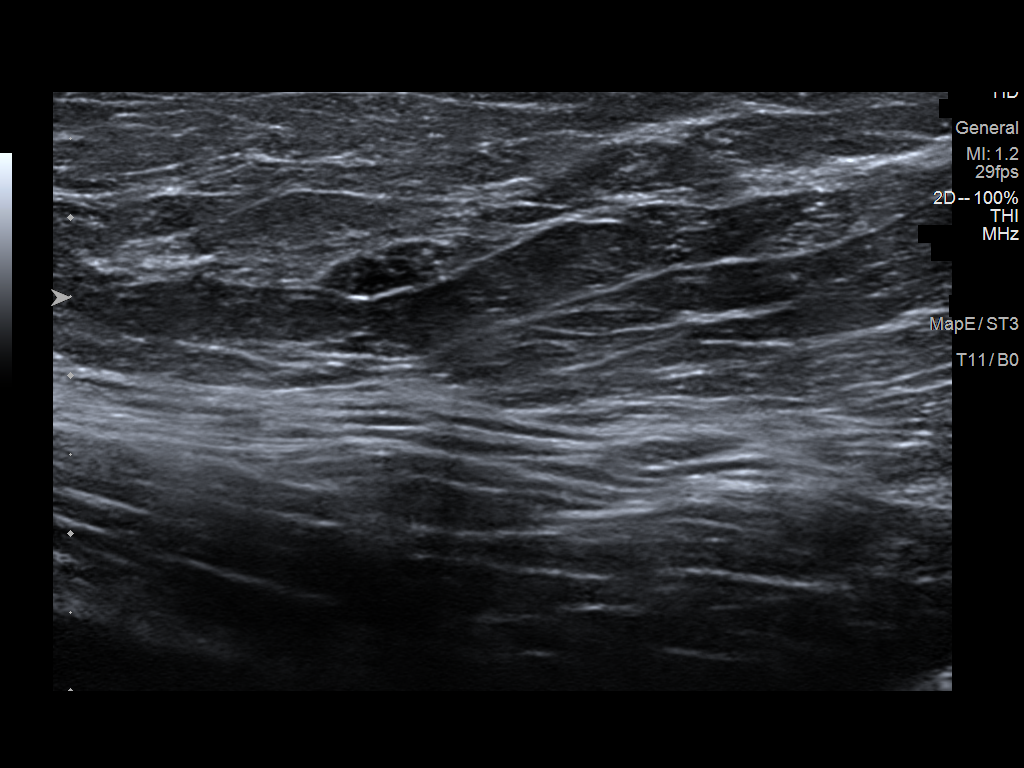
[im 2/6]
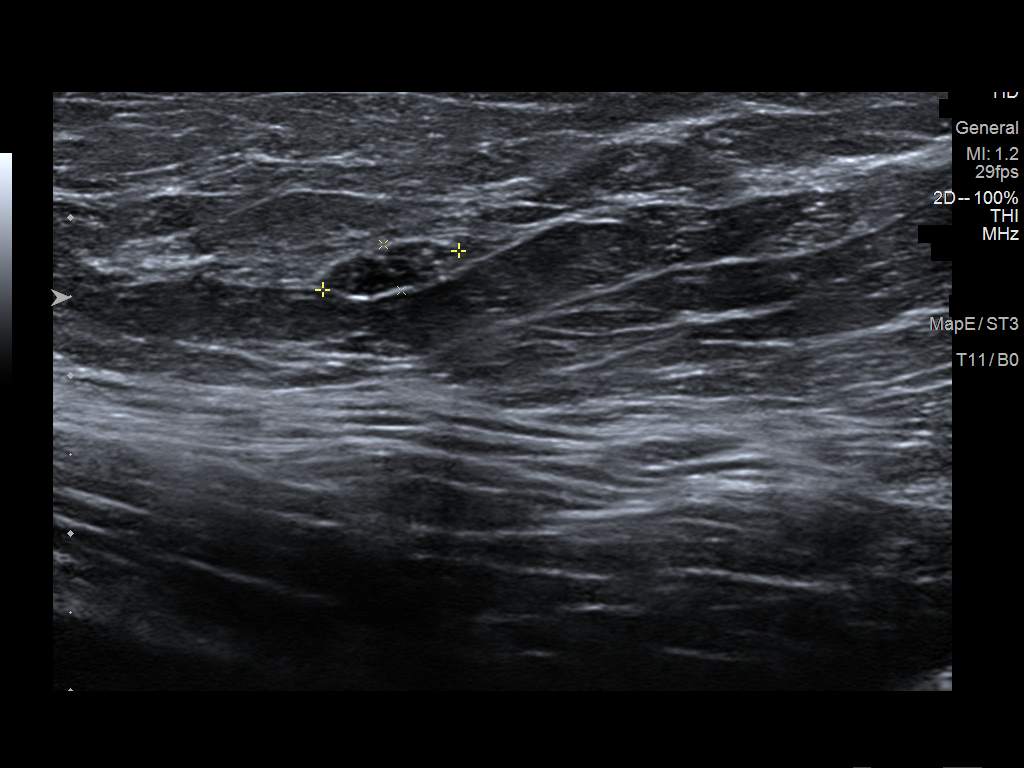
[im 3/6]
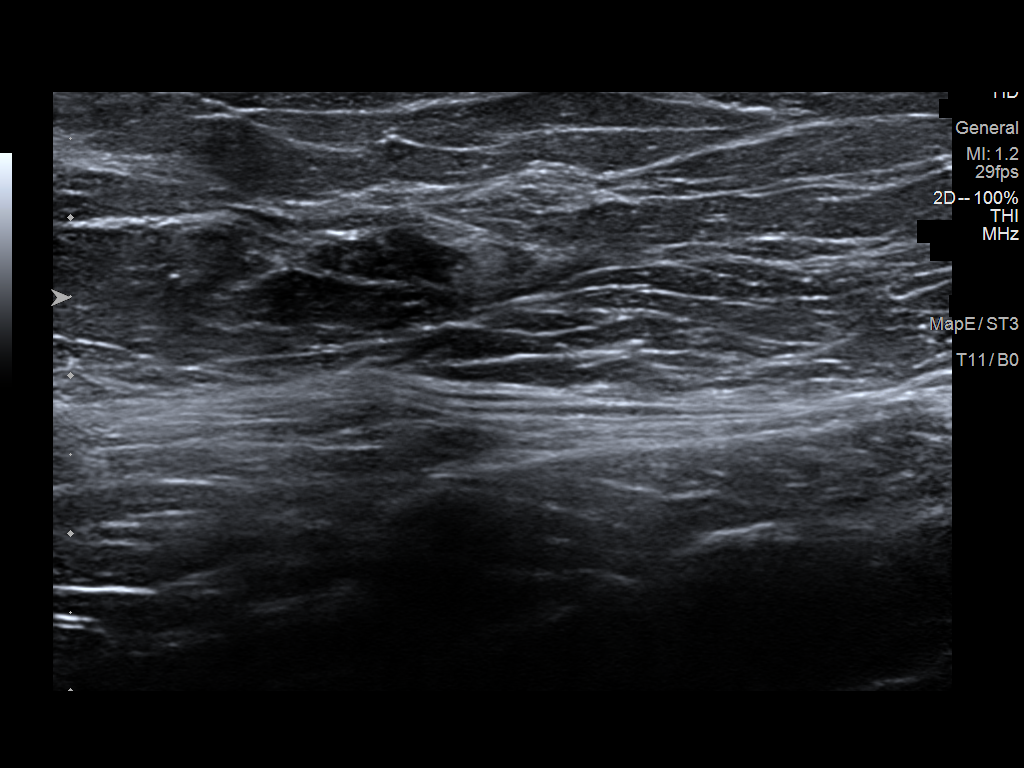
[im 4/6]
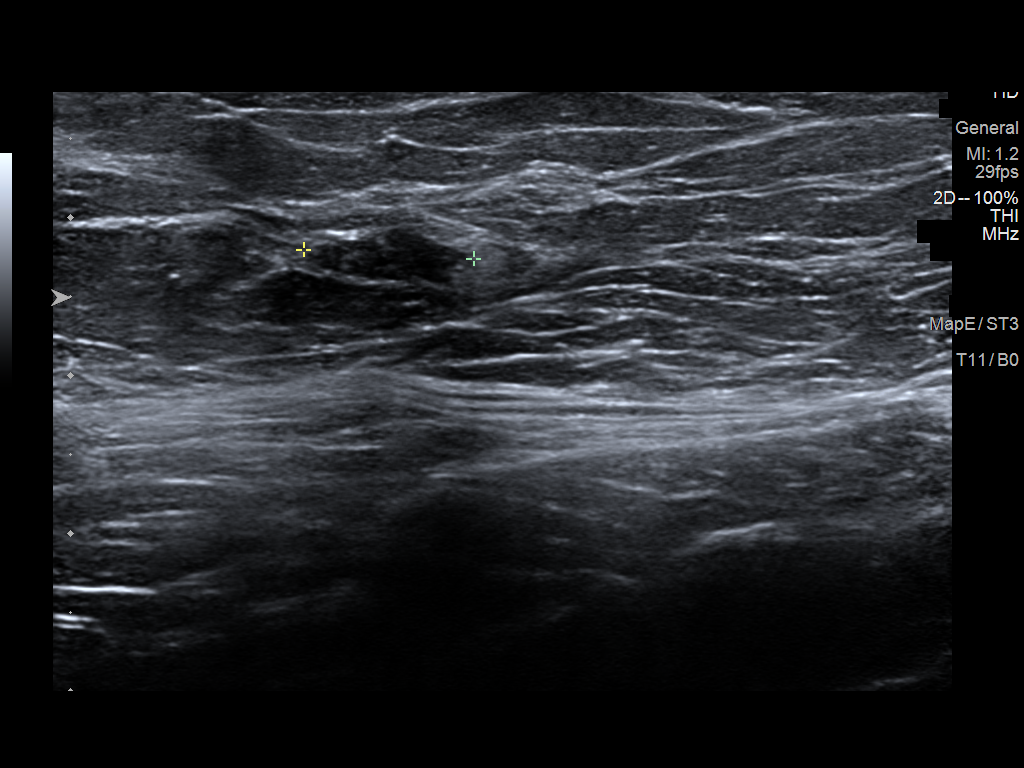
[im 5/6]
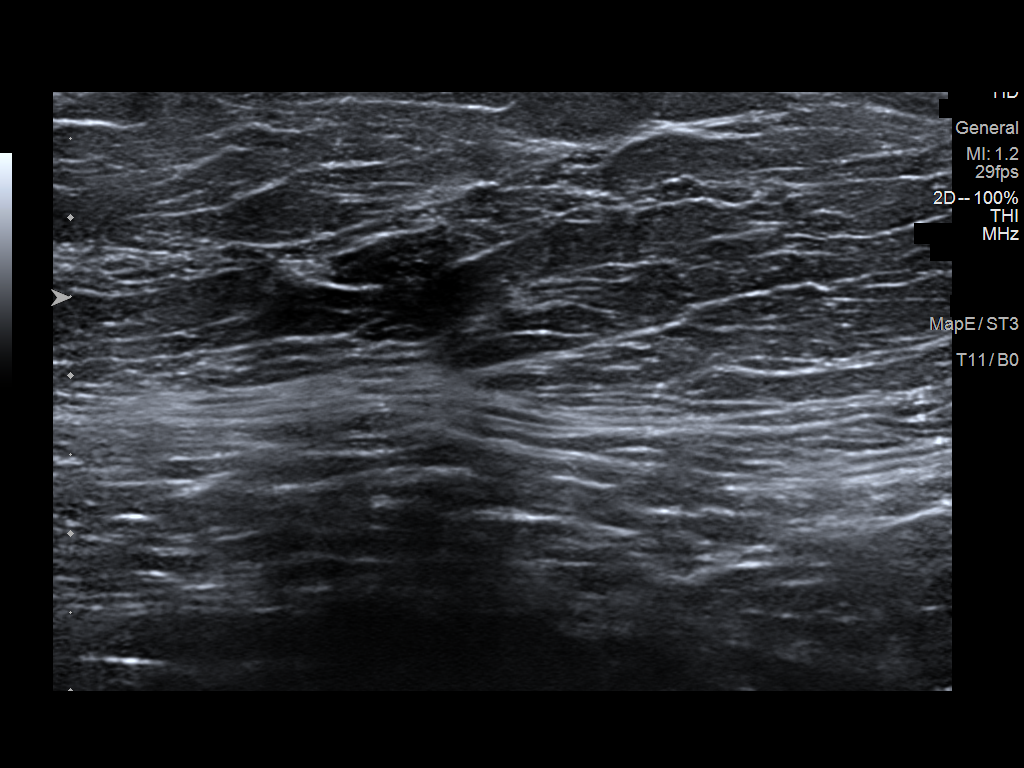
[im 6/6]
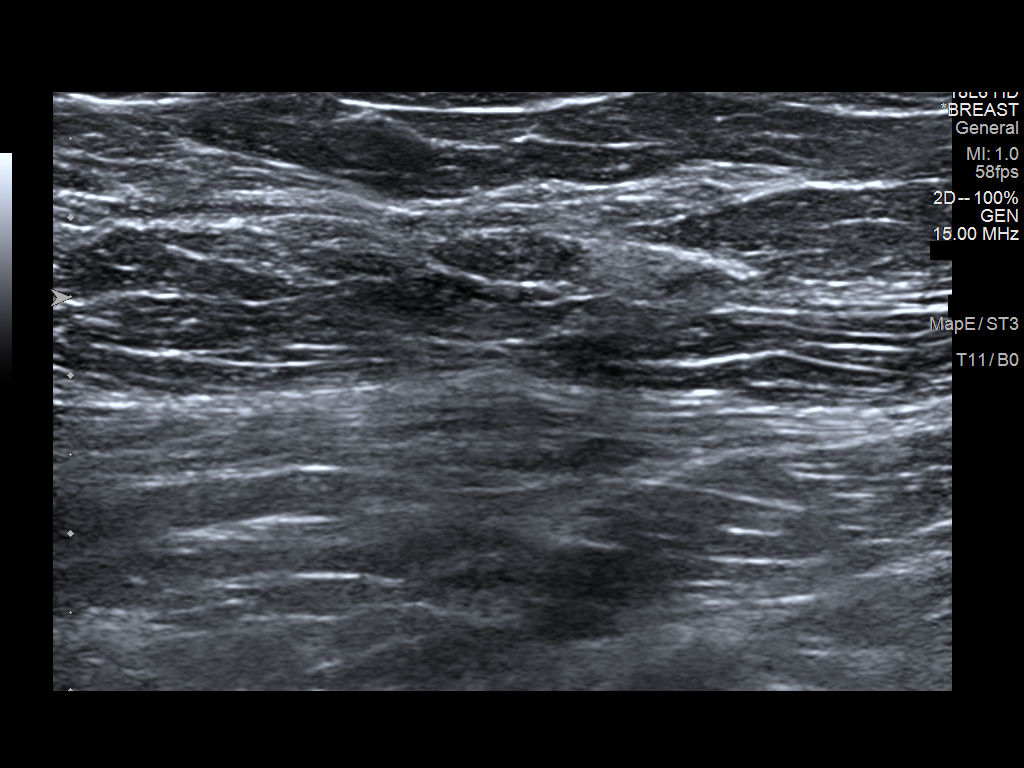

[6 of 6 positions shown; findings below may reference images not displayed]

FINDINGS: On physical exam, no palpable abnormalities are identified within
the OUTER LEFT breast.

Targeted ultrasound is performed, showing a subtle 0.9 x 0.3 x
cm circumscribed oval slightly hypoechoic parallel mass at the 3
o'clock position of the LEFT breast 6 cm from the nipple, likely
representing the mammographic finding. This is best identified on
harmonic imaging.
IMPRESSION: 1. Likely benign 1.1 cm OUTER LEFT breast mass. Six-month follow-up
is recommended to ensure stability.

RECOMMENDATION:
LEFT diagnostic mammogram and LEFT breast ultrasound in 6 months.

I have discussed the findings and recommendations with the patient.
If applicable, a reminder letter will be sent to the patient
regarding the next appointment.

BI-RADS CATEGORY  3: Probably benign.

## 2021-12-13 IMAGING — US US BREAST*L* LIMITED INC AXILLA
1 series · 5 of 5 positions shown · non-contrast
Comparison: November 06, 2019 and November 08, 2019

CLINICAL DATA: 43-year-old patient presents for follow-up of a
probably benign mass in the left breast. She also mentions today
that she has had some diffuse right breast pain that mostly occurs
when she moves her arm. She does not have any lumps in the right
breast and her screening mammogram of the right breast in October 2019 was negative. We discussed that the pain that she experiences
with movement of her right arm is most likely musculoskeletal in
nature.

EXAM:
DIGITAL DIAGNOSTIC LEFT MAMMOGRAM WITH CAD AND TOMO
ULTRASOUND LEFT BREAST

[Series 1: us breast*left* limited inc axilla · 0.06mm/px · 5 of 5 slices shown]
[im 1/5]
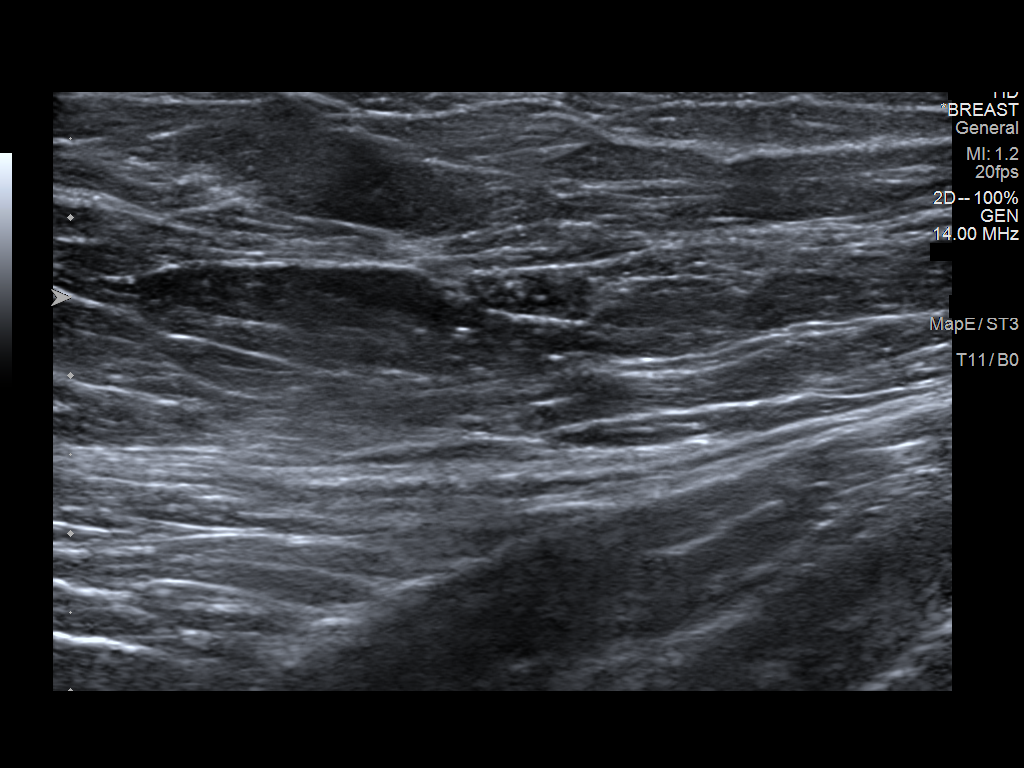
[im 2/5]
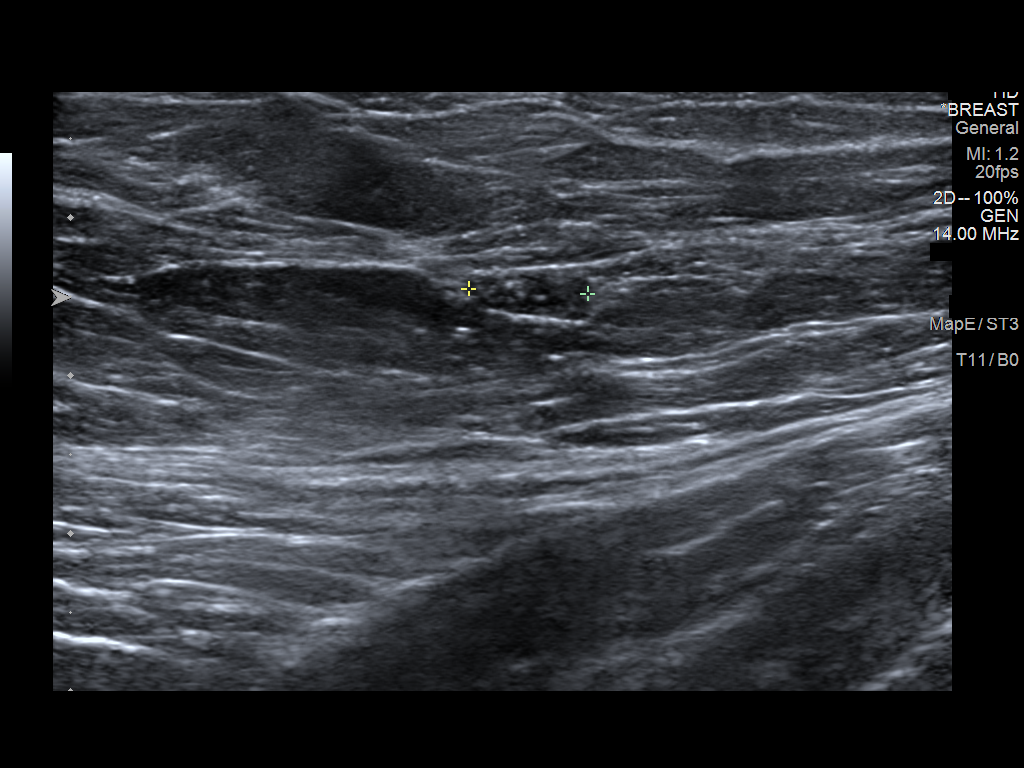
[im 3/5]
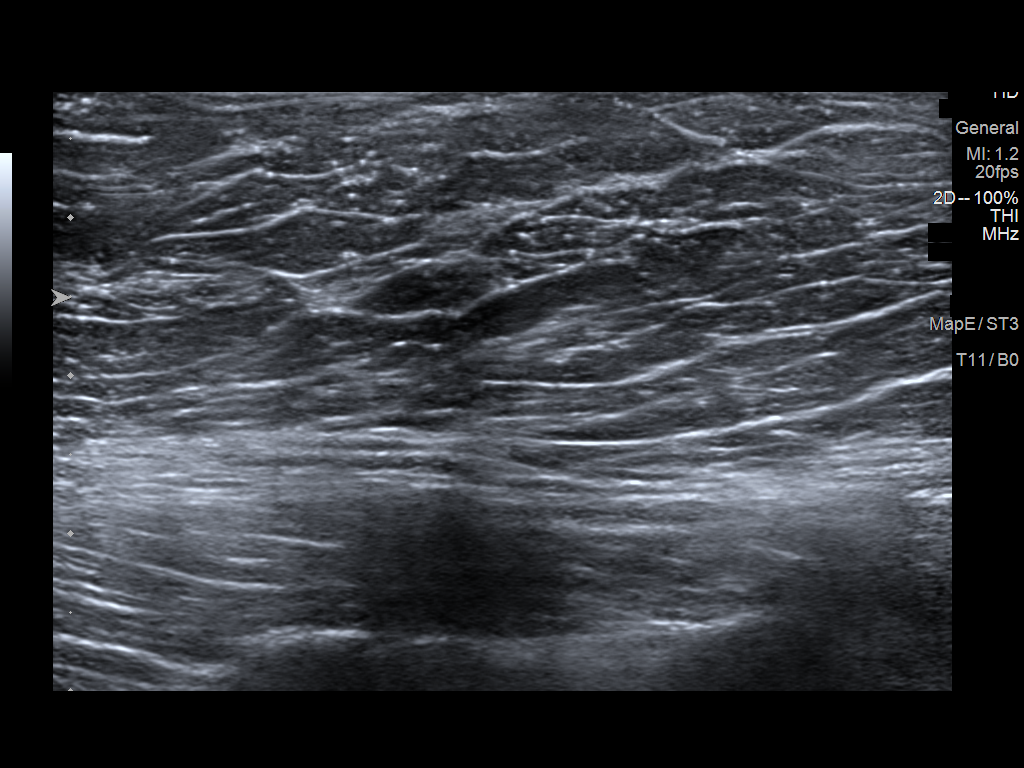
[im 4/5]
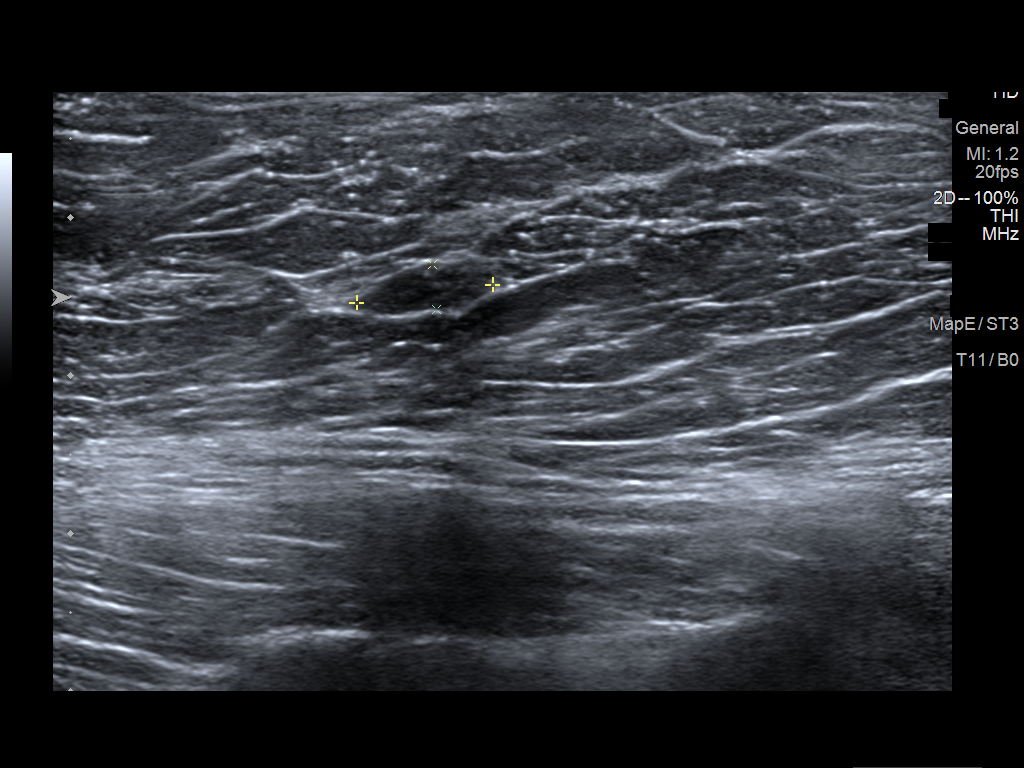
[im 5/5]
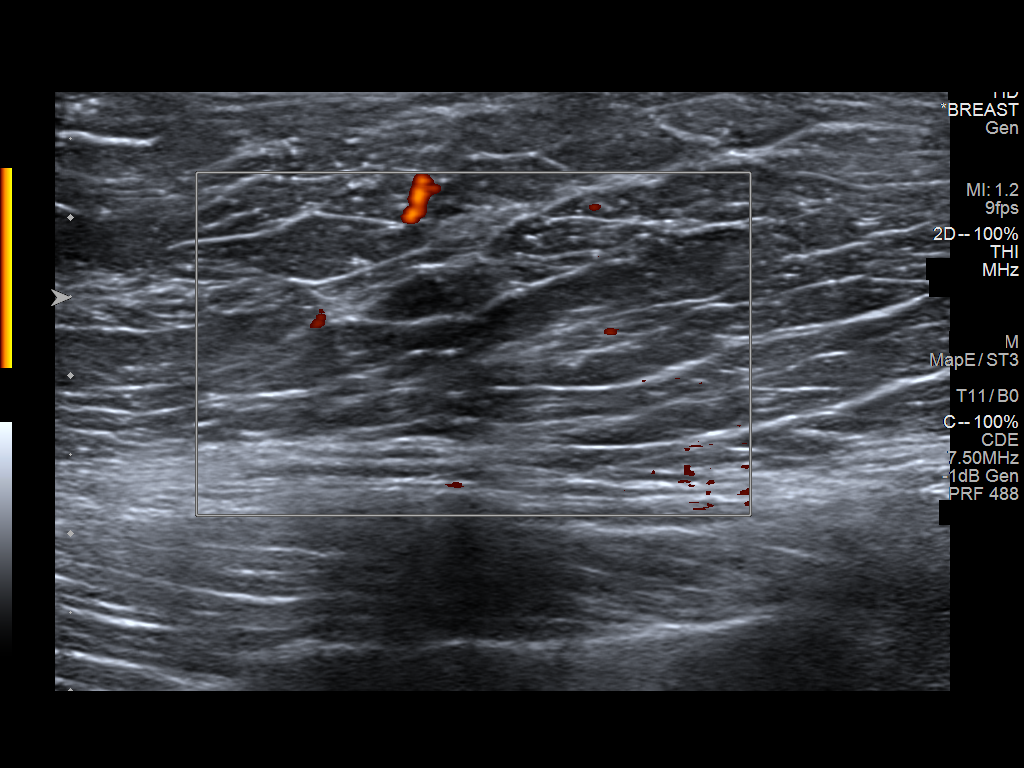

[5 of 5 positions shown; findings below may reference images not displayed]

ACR Breast Density Category b: There are scattered areas of
fibroglandular density.
FINDINGS: An oval circumscribed mass in the outer left breast, middle third,
is mammographically stable. No new or suspicious mass, distortion,
or suspicious microcalcification is identified.

Mammographic images were processed with CAD.

Targeted ultrasound is performed, showing a circumscribed oval
slightly hypoechoic mass at 3 o'clock position 6 cm from the nipple.
Mass measures 0.9 x 0.3 x 0.8 cm. Previously, this mass was measured
to be 0.9 x 0.3 x 1.1 cm in October 2019.
IMPRESSION: Stable probably benign mass in the 3 o'clock position of the left
breast.

RECOMMENDATION:
Bilateral diagnostic mammogram and left breast ultrasound is
recommended in October 2020.

I have discussed the findings and recommendations with the patient.
If applicable, a reminder letter will be sent to the patient
regarding the next appointment.

BI-RADS CATEGORY  3: Probably benign.

## 2021-12-13 IMAGING — MG MM DIGITAL DIAGNOSTIC UNILAT*L* W/ TOMO W/ CAD
4 series · 4 of 12 positions shown · non-contrast
Comparison: November 06, 2019 and November 08, 2019

CLINICAL DATA: 43-year-old patient presents for follow-up of a
probably benign mass in the left breast. She also mentions today
that she has had some diffuse right breast pain that mostly occurs
when she moves her arm. She does not have any lumps in the right
breast and her screening mammogram of the right breast in October 2019 was negative. We discussed that the pain that she experiences
with movement of her right arm is most likely musculoskeletal in
nature.

EXAM:
DIGITAL DIAGNOSTIC LEFT MAMMOGRAM WITH CAD AND TOMO
ULTRASOUND LEFT BREAST

[L CC synth-2D]
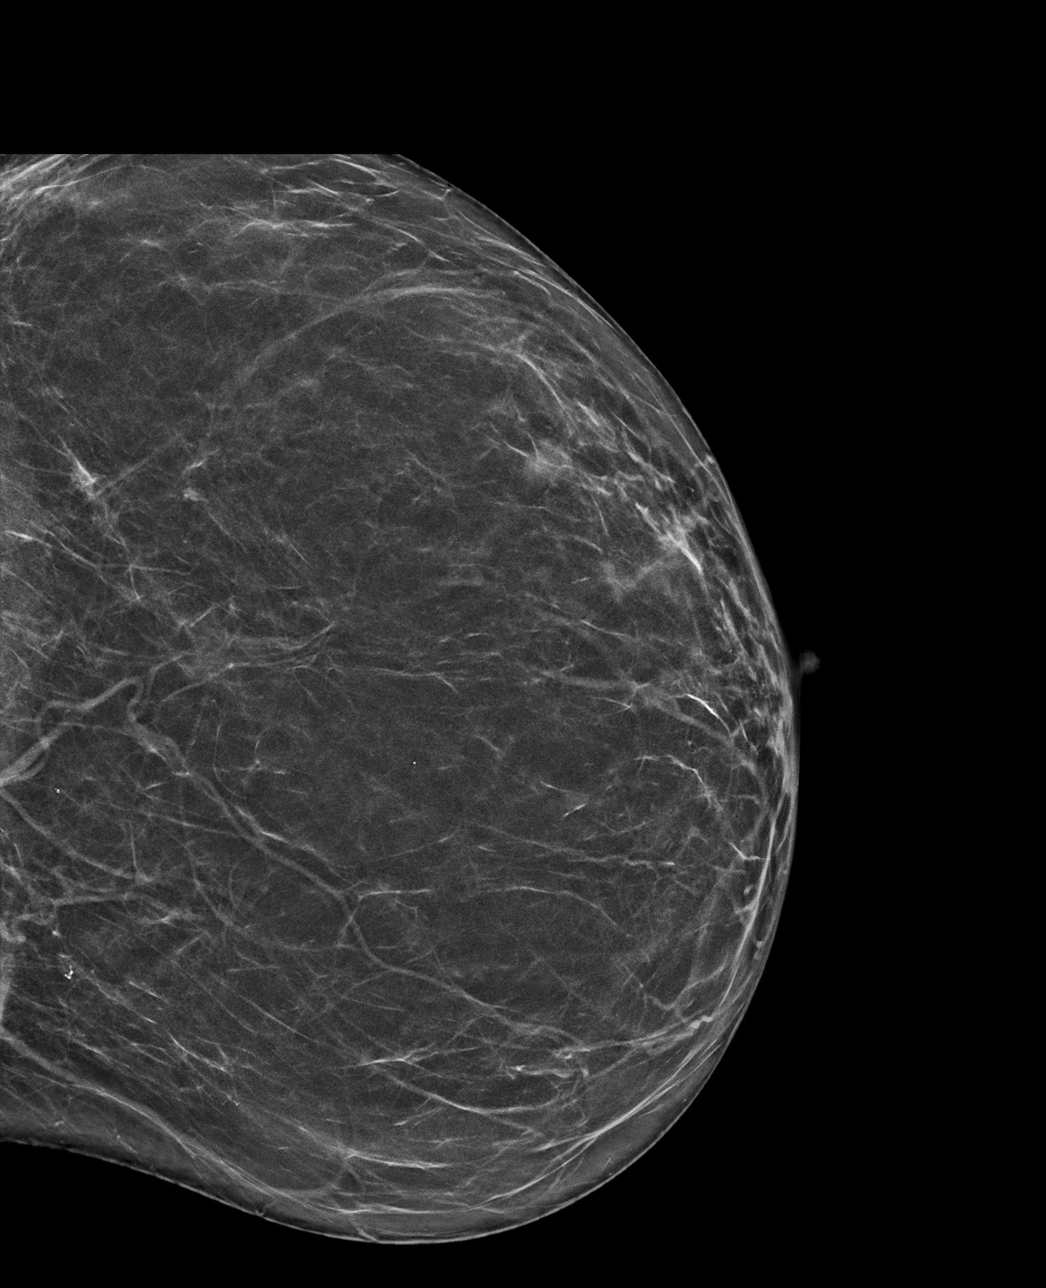

[L MLO synth-2D]
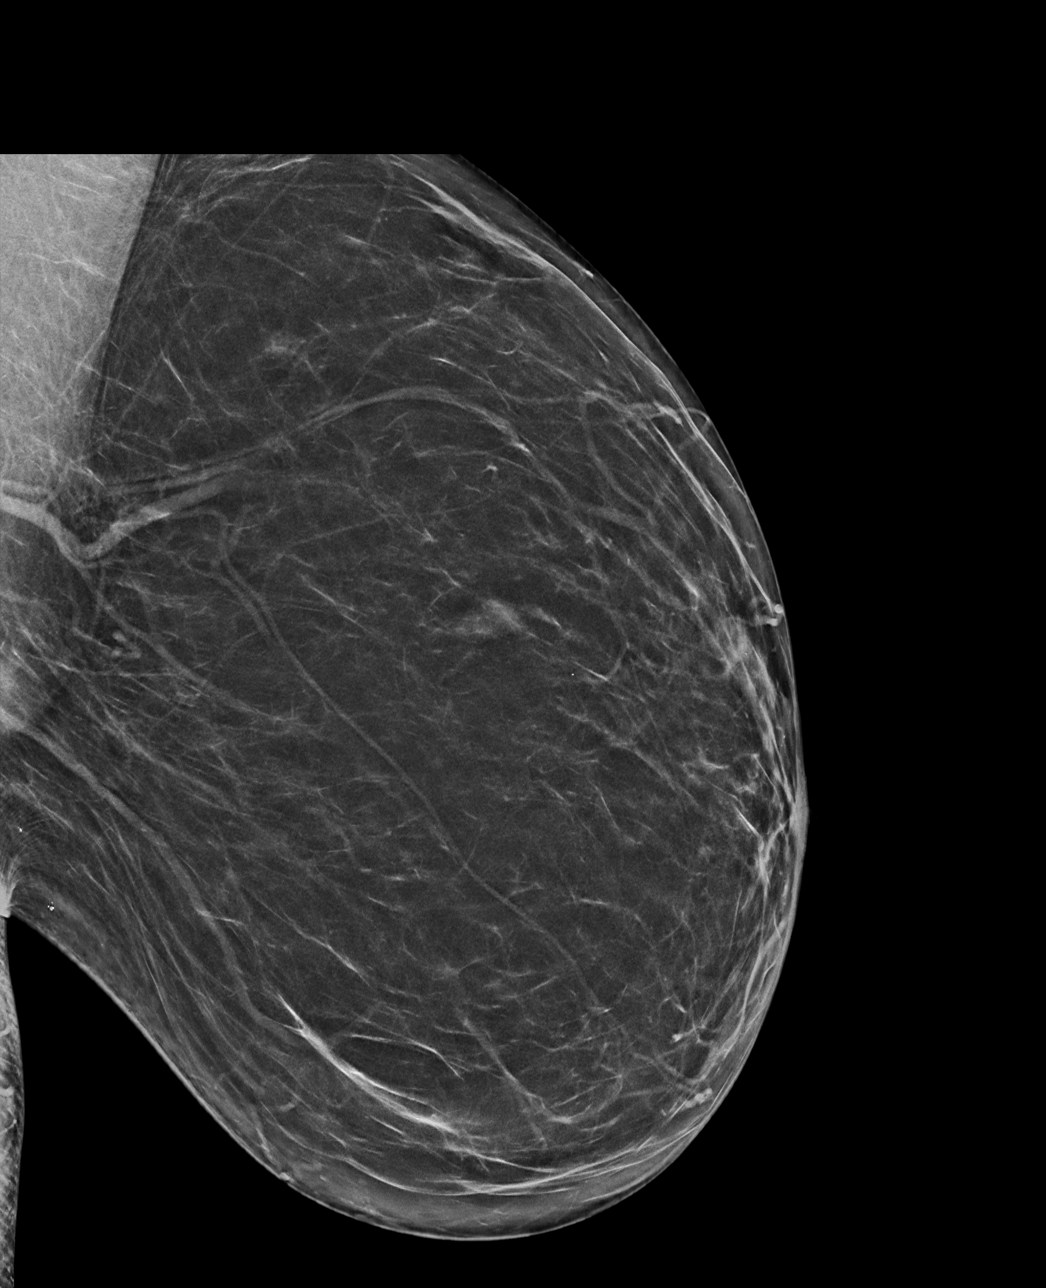

[L CC tomo · tomo slice 33/64.0]
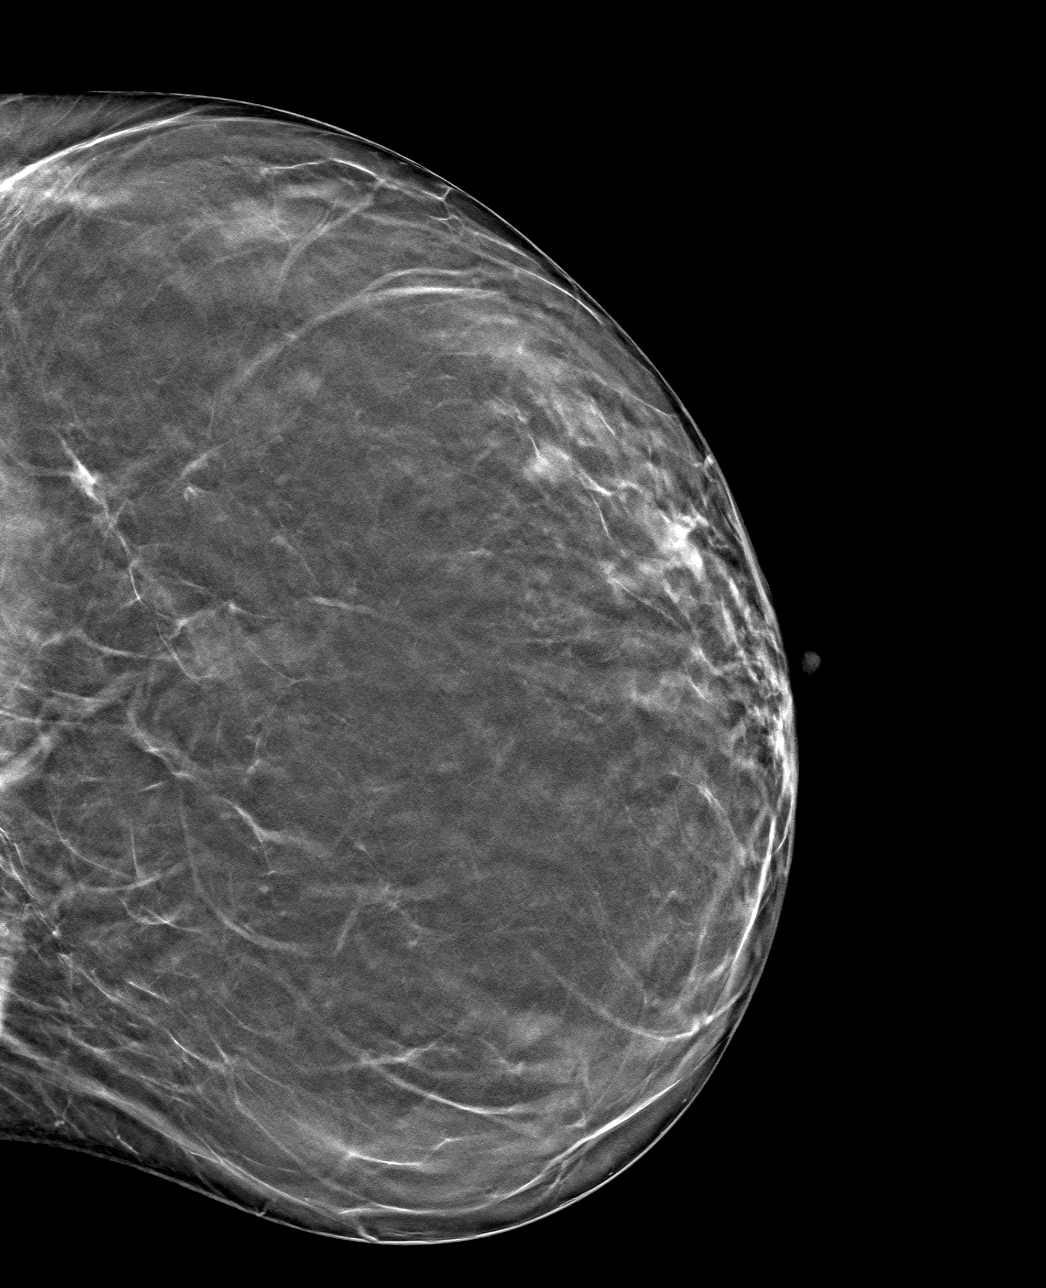

[L MLO tomo · tomo slice 37/72.0]
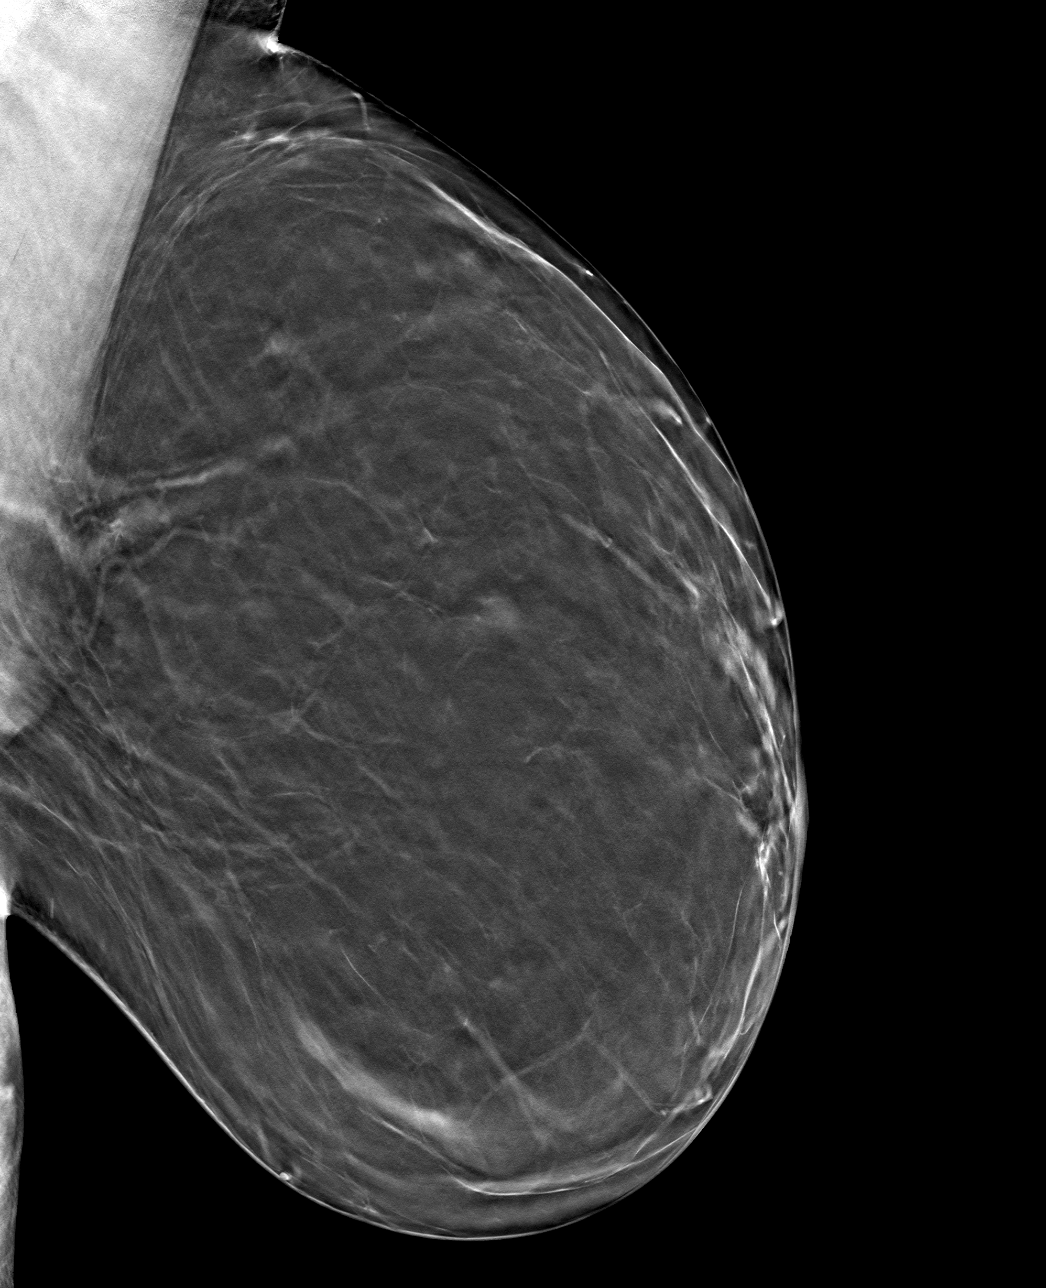

[4 of 12 positions shown; findings below may reference images not displayed]

ACR Breast Density Category b: There are scattered areas of
fibroglandular density.
FINDINGS: An oval circumscribed mass in the outer left breast, middle third,
is mammographically stable. No new or suspicious mass, distortion,
or suspicious microcalcification is identified.

Mammographic images were processed with CAD.

Targeted ultrasound is performed, showing a circumscribed oval
slightly hypoechoic mass at 3 o'clock position 6 cm from the nipple.
Mass measures 0.9 x 0.3 x 0.8 cm. Previously, this mass was measured
to be 0.9 x 0.3 x 1.1 cm in October 2019.
IMPRESSION: Stable probably benign mass in the 3 o'clock position of the left
breast.

RECOMMENDATION:
Bilateral diagnostic mammogram and left breast ultrasound is
recommended in October 2020.

I have discussed the findings and recommendations with the patient.
If applicable, a reminder letter will be sent to the patient
regarding the next appointment.

BI-RADS CATEGORY  3: Probably benign.

## 2024-11-12 ENCOUNTER — Encounter (HOSPITAL_COMMUNITY): Payer: Self-pay | Admitting: Emergency Medicine

## 2024-11-12 ENCOUNTER — Emergency Department (HOSPITAL_COMMUNITY)

## 2024-11-12 ENCOUNTER — Emergency Department (HOSPITAL_COMMUNITY): Admission: EM | Admit: 2024-11-12 | Discharge: 2024-11-12 | Discharge status: Against Medical Advice

## 2024-11-12 ENCOUNTER — Other Ambulatory Visit: Payer: Self-pay

## 2024-11-12 DIAGNOSIS — R079 Chest pain, unspecified: Secondary | ICD-10-CM | POA: Diagnosis present

## 2024-11-12 DIAGNOSIS — N644 Mastodynia: Secondary | ICD-10-CM | POA: Diagnosis not present

## 2024-11-12 DIAGNOSIS — Z5321 Procedure and treatment not carried out due to patient leaving prior to being seen by health care provider: Secondary | ICD-10-CM | POA: Diagnosis not present

## 2024-11-12 LAB — CBC
HCT: 37.6 % (ref 36.0–46.0)
Hemoglobin: 13 g/dL (ref 12.0–15.0)
MCH: 32 pg (ref 26.0–34.0)
MCHC: 34.6 g/dL (ref 30.0–36.0)
MCV: 92.6 fL (ref 80.0–100.0)
Platelets: 342 K/uL (ref 150–400)
RBC: 4.06 MIL/uL (ref 3.87–5.11)
RDW: 11.6 % (ref 11.5–15.5)
WBC: 6.4 K/uL (ref 4.0–10.5)
nRBC: 0 % (ref 0.0–0.2)

## 2024-11-12 LAB — BASIC METABOLIC PANEL WITH GFR
Anion gap: 11 (ref 5–15)
BUN: 6 mg/dL (ref 6–20)
CO2: 20 mmol/L — ABNORMAL LOW (ref 22–32)
Calcium: 8.4 mg/dL — ABNORMAL LOW (ref 8.9–10.3)
Chloride: 105 mmol/L (ref 98–111)
Creatinine, Ser: 0.71 mg/dL (ref 0.44–1.00)
GFR, Estimated: >60 mL/min (ref >60)
Glucose, Bld: 91 mg/dL (ref 70–99)
Potassium: 3.5 mmol/L (ref 3.5–5.1)
Sodium: 136 mmol/L (ref 135–145)

## 2024-11-12 LAB — TROPONIN I (HIGH SENSITIVITY): Troponin I (High Sensitivity): 5 ng/L (ref <18)

## 2024-11-12 LAB — HCG, SERUM, QUALITATIVE: Preg, Serum: NEGATIVE

## 2024-11-12 NOTE — ED Triage Notes (Signed)
 PT complains of chest pain on left side that occasionally goes to left are and to right side of chest. Pt also complains of left breast pain. Denies any SOB. Denies any swelling or drainage in breast. Last mammogram was 2 years ago and was told tissue was dense at that time.

## 2024-11-12 NOTE — ED Notes (Signed)
 Reports leaving the ED, encouraged to stay

## 2024-11-12 NOTE — ED Triage Notes (Signed)
 Pt states she has been having chest pain and breast pain that moves from side to side for a while but states pain in her left breast has been getting worse over the past few weeks.

## 2024-11-12 NOTE — ED Provider Triage Note (Signed)
 Emergency Medicine Provider Triage Evaluation Note  Rachel Hicks , a 48 y.o. female  was evaluated in triage.  Pt complains of Left sided CP, breast pain int for months, no CP now.  Review of Systems  Positive: CP Negative: SOB, fever  Physical Exam  BP 139/71 (BP Location: Right Arm)   Pulse 65   Temp 97.6 F (36.4 C)   Resp 18   Ht 5' 9 (1.753 m)   Wt 86 kg   SpO2 99%   BMI 28.00 kg/m  Gen:   Awake, no distress   Resp:  Normal effort  MSK:   Moves extremities without difficulty  Other:    Medical Decision Making  Medically screening exam initiated at 1:14 PM.  Appropriate orders placed.  Rachel Hicks was informed that the remainder of the evaluation will be completed by another provider, this initial triage assessment does not replace that evaluation, and the importance of remaining in the ED until their evaluation is complete.     Rachel Warren SAILOR, PA-C 11/12/24 1314

## 2024-11-12 NOTE — ED Notes (Signed)
 Patient transported to X-ray
